# Patient Record
Sex: Female | Born: 1935
Health system: Southern US, Community
[De-identification: ages and names within clinical notes are randomized; demographics above are authoritative.]

## PROBLEM LIST (undated history)

## (undated) DIAGNOSIS — I4891 Unspecified atrial fibrillation: Secondary | ICD-10-CM

## (undated) HISTORY — PX: CARDIAC CATHETERIZATION: SHX172

---

## 2017-01-10 DIAGNOSIS — I429 Cardiomyopathy, unspecified: Secondary | ICD-10-CM | POA: Diagnosis not present

## 2017-01-10 DIAGNOSIS — I10 Essential (primary) hypertension: Secondary | ICD-10-CM | POA: Diagnosis not present

## 2017-01-10 DIAGNOSIS — M549 Dorsalgia, unspecified: Secondary | ICD-10-CM | POA: Diagnosis not present

## 2017-01-10 DIAGNOSIS — E785 Hyperlipidemia, unspecified: Secondary | ICD-10-CM | POA: Diagnosis not present

## 2017-01-13 DIAGNOSIS — I10 Essential (primary) hypertension: Secondary | ICD-10-CM | POA: Diagnosis not present

## 2017-01-13 DIAGNOSIS — I429 Cardiomyopathy, unspecified: Secondary | ICD-10-CM | POA: Diagnosis not present

## 2017-01-13 DIAGNOSIS — I480 Paroxysmal atrial fibrillation: Secondary | ICD-10-CM | POA: Diagnosis not present

## 2017-01-13 DIAGNOSIS — E785 Hyperlipidemia, unspecified: Secondary | ICD-10-CM | POA: Diagnosis not present

## 2017-02-10 DIAGNOSIS — D649 Anemia, unspecified: Secondary | ICD-10-CM | POA: Diagnosis not present

## 2017-02-10 DIAGNOSIS — M859 Disorder of bone density and structure, unspecified: Secondary | ICD-10-CM | POA: Diagnosis not present

## 2017-02-14 DIAGNOSIS — M8889 Osteitis deformans of multiple sites: Secondary | ICD-10-CM | POA: Diagnosis not present

## 2017-02-17 DIAGNOSIS — M859 Disorder of bone density and structure, unspecified: Secondary | ICD-10-CM | POA: Diagnosis not present

## 2017-02-17 DIAGNOSIS — M8889 Osteitis deformans of multiple sites: Secondary | ICD-10-CM | POA: Diagnosis not present

## 2017-02-17 DIAGNOSIS — D649 Anemia, unspecified: Secondary | ICD-10-CM | POA: Diagnosis not present

## 2017-02-22 DIAGNOSIS — R350 Frequency of micturition: Secondary | ICD-10-CM | POA: Diagnosis not present

## 2017-02-22 DIAGNOSIS — N390 Urinary tract infection, site not specified: Secondary | ICD-10-CM | POA: Diagnosis not present

## 2017-02-22 DIAGNOSIS — Z6825 Body mass index (BMI) 25.0-25.9, adult: Secondary | ICD-10-CM | POA: Diagnosis not present

## 2017-03-03 DIAGNOSIS — L821 Other seborrheic keratosis: Secondary | ICD-10-CM | POA: Diagnosis not present

## 2017-03-03 DIAGNOSIS — Z85828 Personal history of other malignant neoplasm of skin: Secondary | ICD-10-CM | POA: Diagnosis not present

## 2017-03-03 DIAGNOSIS — D225 Melanocytic nevi of trunk: Secondary | ICD-10-CM | POA: Diagnosis not present

## 2017-03-03 DIAGNOSIS — D485 Neoplasm of uncertain behavior of skin: Secondary | ICD-10-CM | POA: Diagnosis not present

## 2017-03-03 DIAGNOSIS — Z08 Encounter for follow-up examination after completed treatment for malignant neoplasm: Secondary | ICD-10-CM | POA: Diagnosis not present

## 2017-03-03 DIAGNOSIS — L570 Actinic keratosis: Secondary | ICD-10-CM | POA: Diagnosis not present

## 2017-03-17 DIAGNOSIS — M8889 Osteitis deformans of multiple sites: Secondary | ICD-10-CM | POA: Diagnosis not present

## 2017-03-17 DIAGNOSIS — Z79899 Other long term (current) drug therapy: Secondary | ICD-10-CM | POA: Diagnosis not present

## 2017-03-18 DIAGNOSIS — I10 Essential (primary) hypertension: Secondary | ICD-10-CM | POA: Diagnosis not present

## 2017-03-18 DIAGNOSIS — E785 Hyperlipidemia, unspecified: Secondary | ICD-10-CM | POA: Diagnosis not present

## 2017-03-18 DIAGNOSIS — E1165 Type 2 diabetes mellitus with hyperglycemia: Secondary | ICD-10-CM | POA: Diagnosis not present

## 2017-03-18 DIAGNOSIS — I429 Cardiomyopathy, unspecified: Secondary | ICD-10-CM | POA: Diagnosis not present

## 2017-03-18 DIAGNOSIS — J019 Acute sinusitis, unspecified: Secondary | ICD-10-CM | POA: Diagnosis not present

## 2017-03-18 DIAGNOSIS — M5431 Sciatica, right side: Secondary | ICD-10-CM | POA: Diagnosis not present

## 2017-03-24 DIAGNOSIS — R748 Abnormal levels of other serum enzymes: Secondary | ICD-10-CM | POA: Diagnosis not present

## 2017-04-04 DIAGNOSIS — J329 Chronic sinusitis, unspecified: Secondary | ICD-10-CM | POA: Diagnosis not present

## 2017-04-04 DIAGNOSIS — M5431 Sciatica, right side: Secondary | ICD-10-CM | POA: Diagnosis not present

## 2017-04-04 DIAGNOSIS — E1165 Type 2 diabetes mellitus with hyperglycemia: Secondary | ICD-10-CM | POA: Diagnosis not present

## 2017-04-04 DIAGNOSIS — I10 Essential (primary) hypertension: Secondary | ICD-10-CM | POA: Diagnosis not present

## 2017-04-06 DIAGNOSIS — M2578 Osteophyte, vertebrae: Secondary | ICD-10-CM | POA: Diagnosis not present

## 2017-04-06 DIAGNOSIS — M5126 Other intervertebral disc displacement, lumbar region: Secondary | ICD-10-CM | POA: Diagnosis not present

## 2017-04-06 DIAGNOSIS — M4186 Other forms of scoliosis, lumbar region: Secondary | ICD-10-CM | POA: Diagnosis not present

## 2017-04-06 DIAGNOSIS — M4727 Other spondylosis with radiculopathy, lumbosacral region: Secondary | ICD-10-CM | POA: Diagnosis not present

## 2017-04-06 DIAGNOSIS — M5117 Intervertebral disc disorders with radiculopathy, lumbosacral region: Secondary | ICD-10-CM | POA: Diagnosis not present

## 2017-04-06 DIAGNOSIS — M5186 Other intervertebral disc disorders, lumbar region: Secondary | ICD-10-CM | POA: Diagnosis not present

## 2017-04-06 DIAGNOSIS — M48061 Spinal stenosis, lumbar region without neurogenic claudication: Secondary | ICD-10-CM | POA: Diagnosis not present

## 2017-04-06 DIAGNOSIS — M5127 Other intervertebral disc displacement, lumbosacral region: Secondary | ICD-10-CM | POA: Diagnosis not present

## 2017-04-18 DIAGNOSIS — M5431 Sciatica, right side: Secondary | ICD-10-CM | POA: Diagnosis not present

## 2017-04-18 DIAGNOSIS — M129 Arthropathy, unspecified: Secondary | ICD-10-CM | POA: Diagnosis not present

## 2017-04-18 DIAGNOSIS — I480 Paroxysmal atrial fibrillation: Secondary | ICD-10-CM | POA: Diagnosis not present

## 2017-04-18 DIAGNOSIS — I429 Cardiomyopathy, unspecified: Secondary | ICD-10-CM | POA: Diagnosis not present

## 2017-04-18 DIAGNOSIS — G501 Atypical facial pain: Secondary | ICD-10-CM | POA: Diagnosis not present

## 2017-04-18 DIAGNOSIS — J019 Acute sinusitis, unspecified: Secondary | ICD-10-CM | POA: Diagnosis not present

## 2017-04-18 DIAGNOSIS — J329 Chronic sinusitis, unspecified: Secondary | ICD-10-CM | POA: Diagnosis not present

## 2017-04-18 DIAGNOSIS — E785 Hyperlipidemia, unspecified: Secondary | ICD-10-CM | POA: Diagnosis not present

## 2017-05-05 DIAGNOSIS — M5416 Radiculopathy, lumbar region: Secondary | ICD-10-CM | POA: Diagnosis not present

## 2017-05-06 DIAGNOSIS — I480 Paroxysmal atrial fibrillation: Secondary | ICD-10-CM | POA: Diagnosis not present

## 2017-05-06 DIAGNOSIS — R609 Edema, unspecified: Secondary | ICD-10-CM | POA: Diagnosis not present

## 2017-05-06 DIAGNOSIS — I10 Essential (primary) hypertension: Secondary | ICD-10-CM | POA: Diagnosis not present

## 2017-05-09 DIAGNOSIS — E785 Hyperlipidemia, unspecified: Secondary | ICD-10-CM | POA: Diagnosis not present

## 2017-05-09 DIAGNOSIS — E1165 Type 2 diabetes mellitus with hyperglycemia: Secondary | ICD-10-CM | POA: Diagnosis not present

## 2017-05-09 DIAGNOSIS — I5031 Acute diastolic (congestive) heart failure: Secondary | ICD-10-CM | POA: Diagnosis not present

## 2017-05-09 DIAGNOSIS — I10 Essential (primary) hypertension: Secondary | ICD-10-CM | POA: Diagnosis not present

## 2017-05-17 DIAGNOSIS — I4891 Unspecified atrial fibrillation: Secondary | ICD-10-CM | POA: Diagnosis not present

## 2017-05-17 DIAGNOSIS — I5031 Acute diastolic (congestive) heart failure: Secondary | ICD-10-CM | POA: Diagnosis not present

## 2017-05-17 DIAGNOSIS — E785 Hyperlipidemia, unspecified: Secondary | ICD-10-CM | POA: Diagnosis not present

## 2017-05-17 DIAGNOSIS — I10 Essential (primary) hypertension: Secondary | ICD-10-CM | POA: Diagnosis not present

## 2017-05-18 DIAGNOSIS — M545 Low back pain: Secondary | ICD-10-CM | POA: Diagnosis not present

## 2017-05-18 DIAGNOSIS — M5416 Radiculopathy, lumbar region: Secondary | ICD-10-CM | POA: Diagnosis not present

## 2017-05-18 DIAGNOSIS — Z1389 Encounter for screening for other disorder: Secondary | ICD-10-CM | POA: Diagnosis not present

## 2017-05-18 DIAGNOSIS — M8888 Osteitis deformans of other bones: Secondary | ICD-10-CM | POA: Diagnosis not present

## 2017-05-24 DIAGNOSIS — I10 Essential (primary) hypertension: Secondary | ICD-10-CM | POA: Diagnosis not present

## 2017-05-24 DIAGNOSIS — E785 Hyperlipidemia, unspecified: Secondary | ICD-10-CM | POA: Diagnosis not present

## 2017-05-24 DIAGNOSIS — I4891 Unspecified atrial fibrillation: Secondary | ICD-10-CM | POA: Diagnosis not present

## 2017-05-25 DIAGNOSIS — I429 Cardiomyopathy, unspecified: Secondary | ICD-10-CM | POA: Diagnosis not present

## 2017-05-25 DIAGNOSIS — E1165 Type 2 diabetes mellitus with hyperglycemia: Secondary | ICD-10-CM | POA: Diagnosis not present

## 2017-05-25 DIAGNOSIS — M129 Arthropathy, unspecified: Secondary | ICD-10-CM | POA: Diagnosis not present

## 2017-05-25 DIAGNOSIS — I10 Essential (primary) hypertension: Secondary | ICD-10-CM | POA: Diagnosis not present

## 2017-06-10 DIAGNOSIS — E1165 Type 2 diabetes mellitus with hyperglycemia: Secondary | ICD-10-CM | POA: Diagnosis not present

## 2017-06-10 DIAGNOSIS — E785 Hyperlipidemia, unspecified: Secondary | ICD-10-CM | POA: Diagnosis not present

## 2017-06-10 DIAGNOSIS — R197 Diarrhea, unspecified: Secondary | ICD-10-CM | POA: Diagnosis not present

## 2017-06-10 DIAGNOSIS — M5431 Sciatica, right side: Secondary | ICD-10-CM | POA: Diagnosis not present

## 2017-06-10 DIAGNOSIS — I480 Paroxysmal atrial fibrillation: Secondary | ICD-10-CM | POA: Diagnosis not present

## 2017-06-10 DIAGNOSIS — M129 Arthropathy, unspecified: Secondary | ICD-10-CM | POA: Diagnosis not present

## 2017-06-10 DIAGNOSIS — I429 Cardiomyopathy, unspecified: Secondary | ICD-10-CM | POA: Diagnosis not present

## 2017-06-10 DIAGNOSIS — I10 Essential (primary) hypertension: Secondary | ICD-10-CM | POA: Diagnosis not present

## 2017-06-14 DIAGNOSIS — D509 Iron deficiency anemia, unspecified: Secondary | ICD-10-CM | POA: Diagnosis not present

## 2017-06-14 DIAGNOSIS — M8889 Osteitis deformans of multiple sites: Secondary | ICD-10-CM | POA: Diagnosis not present

## 2017-06-17 DIAGNOSIS — D225 Melanocytic nevi of trunk: Secondary | ICD-10-CM | POA: Diagnosis not present

## 2017-06-23 DIAGNOSIS — M810 Age-related osteoporosis without current pathological fracture: Secondary | ICD-10-CM | POA: Diagnosis not present

## 2017-06-23 DIAGNOSIS — M4186 Other forms of scoliosis, lumbar region: Secondary | ICD-10-CM | POA: Diagnosis not present

## 2017-06-23 DIAGNOSIS — M8888 Osteitis deformans of other bones: Secondary | ICD-10-CM | POA: Diagnosis not present

## 2017-06-23 DIAGNOSIS — M5136 Other intervertebral disc degeneration, lumbar region: Secondary | ICD-10-CM | POA: Diagnosis not present

## 2017-06-23 DIAGNOSIS — M5126 Other intervertebral disc displacement, lumbar region: Secondary | ICD-10-CM | POA: Diagnosis not present

## 2017-06-23 DIAGNOSIS — M5416 Radiculopathy, lumbar region: Secondary | ICD-10-CM | POA: Diagnosis not present

## 2017-06-29 DIAGNOSIS — R300 Dysuria: Secondary | ICD-10-CM | POA: Diagnosis not present

## 2017-06-29 DIAGNOSIS — N760 Acute vaginitis: Secondary | ICD-10-CM | POA: Diagnosis not present

## 2017-07-27 DIAGNOSIS — I10 Essential (primary) hypertension: Secondary | ICD-10-CM | POA: Diagnosis not present

## 2017-07-27 DIAGNOSIS — E1165 Type 2 diabetes mellitus with hyperglycemia: Secondary | ICD-10-CM | POA: Diagnosis not present

## 2017-07-27 DIAGNOSIS — I480 Paroxysmal atrial fibrillation: Secondary | ICD-10-CM | POA: Diagnosis not present

## 2017-07-27 DIAGNOSIS — M549 Dorsalgia, unspecified: Secondary | ICD-10-CM | POA: Diagnosis not present

## 2017-07-27 DIAGNOSIS — E785 Hyperlipidemia, unspecified: Secondary | ICD-10-CM | POA: Diagnosis not present

## 2017-08-01 DIAGNOSIS — M4186 Other forms of scoliosis, lumbar region: Secondary | ICD-10-CM | POA: Diagnosis not present

## 2017-08-01 DIAGNOSIS — M810 Age-related osteoporosis without current pathological fracture: Secondary | ICD-10-CM | POA: Diagnosis not present

## 2017-08-01 DIAGNOSIS — M5126 Other intervertebral disc displacement, lumbar region: Secondary | ICD-10-CM | POA: Diagnosis not present

## 2017-08-01 DIAGNOSIS — M5416 Radiculopathy, lumbar region: Secondary | ICD-10-CM | POA: Diagnosis not present

## 2017-08-01 DIAGNOSIS — M8888 Osteitis deformans of other bones: Secondary | ICD-10-CM | POA: Diagnosis not present

## 2017-08-01 DIAGNOSIS — M5136 Other intervertebral disc degeneration, lumbar region: Secondary | ICD-10-CM | POA: Diagnosis not present

## 2017-08-22 DIAGNOSIS — I4891 Unspecified atrial fibrillation: Secondary | ICD-10-CM | POA: Diagnosis not present

## 2017-08-22 DIAGNOSIS — I10 Essential (primary) hypertension: Secondary | ICD-10-CM | POA: Diagnosis not present

## 2017-08-22 DIAGNOSIS — I2510 Atherosclerotic heart disease of native coronary artery without angina pectoris: Secondary | ICD-10-CM | POA: Diagnosis not present

## 2017-08-22 DIAGNOSIS — E785 Hyperlipidemia, unspecified: Secondary | ICD-10-CM | POA: Diagnosis not present

## 2017-08-25 DIAGNOSIS — M48062 Spinal stenosis, lumbar region with neurogenic claudication: Secondary | ICD-10-CM | POA: Diagnosis not present

## 2017-08-25 DIAGNOSIS — M5416 Radiculopathy, lumbar region: Secondary | ICD-10-CM | POA: Diagnosis not present

## 2017-08-25 DIAGNOSIS — M5417 Radiculopathy, lumbosacral region: Secondary | ICD-10-CM | POA: Diagnosis not present

## 2017-09-05 DIAGNOSIS — M129 Arthropathy, unspecified: Secondary | ICD-10-CM | POA: Diagnosis not present

## 2017-09-05 DIAGNOSIS — M5431 Sciatica, right side: Secondary | ICD-10-CM | POA: Diagnosis not present

## 2017-09-05 DIAGNOSIS — E1165 Type 2 diabetes mellitus with hyperglycemia: Secondary | ICD-10-CM | POA: Diagnosis not present

## 2017-09-05 DIAGNOSIS — E785 Hyperlipidemia, unspecified: Secondary | ICD-10-CM | POA: Diagnosis not present

## 2017-09-05 DIAGNOSIS — R300 Dysuria: Secondary | ICD-10-CM | POA: Diagnosis not present

## 2017-09-05 DIAGNOSIS — I2510 Atherosclerotic heart disease of native coronary artery without angina pectoris: Secondary | ICD-10-CM | POA: Diagnosis not present

## 2017-09-05 DIAGNOSIS — G500 Trigeminal neuralgia: Secondary | ICD-10-CM | POA: Diagnosis not present

## 2017-09-05 DIAGNOSIS — I429 Cardiomyopathy, unspecified: Secondary | ICD-10-CM | POA: Diagnosis not present

## 2017-10-13 DIAGNOSIS — M8889 Osteitis deformans of multiple sites: Secondary | ICD-10-CM | POA: Diagnosis not present

## 2017-10-13 DIAGNOSIS — D509 Iron deficiency anemia, unspecified: Secondary | ICD-10-CM | POA: Diagnosis not present

## 2017-10-13 DIAGNOSIS — Z23 Encounter for immunization: Secondary | ICD-10-CM | POA: Diagnosis not present

## 2017-10-13 DIAGNOSIS — M79606 Pain in leg, unspecified: Secondary | ICD-10-CM | POA: Diagnosis not present

## 2017-10-17 DIAGNOSIS — M9903 Segmental and somatic dysfunction of lumbar region: Secondary | ICD-10-CM | POA: Diagnosis not present

## 2017-10-17 DIAGNOSIS — M5116 Intervertebral disc disorders with radiculopathy, lumbar region: Secondary | ICD-10-CM | POA: Diagnosis not present

## 2017-10-17 DIAGNOSIS — M5441 Lumbago with sciatica, right side: Secondary | ICD-10-CM | POA: Diagnosis not present

## 2017-10-17 DIAGNOSIS — S336XXA Sprain of sacroiliac joint, initial encounter: Secondary | ICD-10-CM | POA: Diagnosis not present

## 2017-10-19 DIAGNOSIS — M9903 Segmental and somatic dysfunction of lumbar region: Secondary | ICD-10-CM | POA: Diagnosis not present

## 2017-10-19 DIAGNOSIS — M5116 Intervertebral disc disorders with radiculopathy, lumbar region: Secondary | ICD-10-CM | POA: Diagnosis not present

## 2017-10-19 DIAGNOSIS — M5441 Lumbago with sciatica, right side: Secondary | ICD-10-CM | POA: Diagnosis not present

## 2017-10-19 DIAGNOSIS — S336XXA Sprain of sacroiliac joint, initial encounter: Secondary | ICD-10-CM | POA: Diagnosis not present

## 2017-10-24 DIAGNOSIS — M9903 Segmental and somatic dysfunction of lumbar region: Secondary | ICD-10-CM | POA: Diagnosis not present

## 2017-10-24 DIAGNOSIS — M5116 Intervertebral disc disorders with radiculopathy, lumbar region: Secondary | ICD-10-CM | POA: Diagnosis not present

## 2017-10-24 DIAGNOSIS — S336XXA Sprain of sacroiliac joint, initial encounter: Secondary | ICD-10-CM | POA: Diagnosis not present

## 2017-10-24 DIAGNOSIS — M5441 Lumbago with sciatica, right side: Secondary | ICD-10-CM | POA: Diagnosis not present

## 2017-10-25 DIAGNOSIS — M5441 Lumbago with sciatica, right side: Secondary | ICD-10-CM | POA: Diagnosis not present

## 2017-10-25 DIAGNOSIS — M9903 Segmental and somatic dysfunction of lumbar region: Secondary | ICD-10-CM | POA: Diagnosis not present

## 2017-10-25 DIAGNOSIS — S336XXA Sprain of sacroiliac joint, initial encounter: Secondary | ICD-10-CM | POA: Diagnosis not present

## 2017-10-25 DIAGNOSIS — M5116 Intervertebral disc disorders with radiculopathy, lumbar region: Secondary | ICD-10-CM | POA: Diagnosis not present

## 2017-10-31 DIAGNOSIS — M5441 Lumbago with sciatica, right side: Secondary | ICD-10-CM | POA: Diagnosis not present

## 2017-10-31 DIAGNOSIS — S336XXA Sprain of sacroiliac joint, initial encounter: Secondary | ICD-10-CM | POA: Diagnosis not present

## 2017-10-31 DIAGNOSIS — M9903 Segmental and somatic dysfunction of lumbar region: Secondary | ICD-10-CM | POA: Diagnosis not present

## 2017-10-31 DIAGNOSIS — M5116 Intervertebral disc disorders with radiculopathy, lumbar region: Secondary | ICD-10-CM | POA: Diagnosis not present

## 2017-11-02 DIAGNOSIS — M5116 Intervertebral disc disorders with radiculopathy, lumbar region: Secondary | ICD-10-CM | POA: Diagnosis not present

## 2017-11-02 DIAGNOSIS — S336XXA Sprain of sacroiliac joint, initial encounter: Secondary | ICD-10-CM | POA: Diagnosis not present

## 2017-11-02 DIAGNOSIS — M9903 Segmental and somatic dysfunction of lumbar region: Secondary | ICD-10-CM | POA: Diagnosis not present

## 2017-11-02 DIAGNOSIS — M5441 Lumbago with sciatica, right side: Secondary | ICD-10-CM | POA: Diagnosis not present

## 2017-11-04 DIAGNOSIS — M5116 Intervertebral disc disorders with radiculopathy, lumbar region: Secondary | ICD-10-CM | POA: Diagnosis not present

## 2017-11-04 DIAGNOSIS — S336XXA Sprain of sacroiliac joint, initial encounter: Secondary | ICD-10-CM | POA: Diagnosis not present

## 2017-11-04 DIAGNOSIS — M5441 Lumbago with sciatica, right side: Secondary | ICD-10-CM | POA: Diagnosis not present

## 2017-11-04 DIAGNOSIS — M9903 Segmental and somatic dysfunction of lumbar region: Secondary | ICD-10-CM | POA: Diagnosis not present

## 2017-11-07 DIAGNOSIS — M9903 Segmental and somatic dysfunction of lumbar region: Secondary | ICD-10-CM | POA: Diagnosis not present

## 2017-11-07 DIAGNOSIS — S336XXA Sprain of sacroiliac joint, initial encounter: Secondary | ICD-10-CM | POA: Diagnosis not present

## 2017-11-07 DIAGNOSIS — M5441 Lumbago with sciatica, right side: Secondary | ICD-10-CM | POA: Diagnosis not present

## 2017-11-07 DIAGNOSIS — M5116 Intervertebral disc disorders with radiculopathy, lumbar region: Secondary | ICD-10-CM | POA: Diagnosis not present

## 2017-11-09 DIAGNOSIS — M5116 Intervertebral disc disorders with radiculopathy, lumbar region: Secondary | ICD-10-CM | POA: Diagnosis not present

## 2017-11-09 DIAGNOSIS — S336XXA Sprain of sacroiliac joint, initial encounter: Secondary | ICD-10-CM | POA: Diagnosis not present

## 2017-11-09 DIAGNOSIS — M5441 Lumbago with sciatica, right side: Secondary | ICD-10-CM | POA: Diagnosis not present

## 2017-11-09 DIAGNOSIS — M9903 Segmental and somatic dysfunction of lumbar region: Secondary | ICD-10-CM | POA: Diagnosis not present

## 2017-11-14 DIAGNOSIS — M5441 Lumbago with sciatica, right side: Secondary | ICD-10-CM | POA: Diagnosis not present

## 2017-11-14 DIAGNOSIS — S336XXA Sprain of sacroiliac joint, initial encounter: Secondary | ICD-10-CM | POA: Diagnosis not present

## 2017-11-14 DIAGNOSIS — M5116 Intervertebral disc disorders with radiculopathy, lumbar region: Secondary | ICD-10-CM | POA: Diagnosis not present

## 2017-11-14 DIAGNOSIS — M9903 Segmental and somatic dysfunction of lumbar region: Secondary | ICD-10-CM | POA: Diagnosis not present

## 2017-11-21 DIAGNOSIS — I10 Essential (primary) hypertension: Secondary | ICD-10-CM | POA: Diagnosis not present

## 2017-11-21 DIAGNOSIS — E785 Hyperlipidemia, unspecified: Secondary | ICD-10-CM | POA: Diagnosis not present

## 2017-11-21 DIAGNOSIS — I4891 Unspecified atrial fibrillation: Secondary | ICD-10-CM | POA: Diagnosis not present

## 2017-11-22 DIAGNOSIS — S336XXA Sprain of sacroiliac joint, initial encounter: Secondary | ICD-10-CM | POA: Diagnosis not present

## 2017-11-22 DIAGNOSIS — M9903 Segmental and somatic dysfunction of lumbar region: Secondary | ICD-10-CM | POA: Diagnosis not present

## 2017-11-22 DIAGNOSIS — M5441 Lumbago with sciatica, right side: Secondary | ICD-10-CM | POA: Diagnosis not present

## 2017-11-22 DIAGNOSIS — M5116 Intervertebral disc disorders with radiculopathy, lumbar region: Secondary | ICD-10-CM | POA: Diagnosis not present

## 2017-11-23 DIAGNOSIS — S336XXA Sprain of sacroiliac joint, initial encounter: Secondary | ICD-10-CM | POA: Diagnosis not present

## 2017-11-23 DIAGNOSIS — M5116 Intervertebral disc disorders with radiculopathy, lumbar region: Secondary | ICD-10-CM | POA: Diagnosis not present

## 2017-11-23 DIAGNOSIS — M5441 Lumbago with sciatica, right side: Secondary | ICD-10-CM | POA: Diagnosis not present

## 2017-11-23 DIAGNOSIS — M9903 Segmental and somatic dysfunction of lumbar region: Secondary | ICD-10-CM | POA: Diagnosis not present

## 2017-11-28 DIAGNOSIS — S336XXA Sprain of sacroiliac joint, initial encounter: Secondary | ICD-10-CM | POA: Diagnosis not present

## 2017-11-28 DIAGNOSIS — M9903 Segmental and somatic dysfunction of lumbar region: Secondary | ICD-10-CM | POA: Diagnosis not present

## 2017-11-28 DIAGNOSIS — M5116 Intervertebral disc disorders with radiculopathy, lumbar region: Secondary | ICD-10-CM | POA: Diagnosis not present

## 2017-11-28 DIAGNOSIS — M5441 Lumbago with sciatica, right side: Secondary | ICD-10-CM | POA: Diagnosis not present

## 2017-12-01 DIAGNOSIS — Z1231 Encounter for screening mammogram for malignant neoplasm of breast: Secondary | ICD-10-CM | POA: Diagnosis not present

## 2017-12-01 DIAGNOSIS — Z803 Family history of malignant neoplasm of breast: Secondary | ICD-10-CM | POA: Diagnosis not present

## 2017-12-01 DIAGNOSIS — Z9289 Personal history of other medical treatment: Secondary | ICD-10-CM | POA: Diagnosis not present

## 2018-01-08 DIAGNOSIS — N390 Urinary tract infection, site not specified: Secondary | ICD-10-CM | POA: Diagnosis not present

## 2018-02-06 DIAGNOSIS — D509 Iron deficiency anemia, unspecified: Secondary | ICD-10-CM | POA: Diagnosis not present

## 2018-02-06 DIAGNOSIS — M8889 Osteitis deformans of multiple sites: Secondary | ICD-10-CM | POA: Diagnosis not present

## 2018-02-16 DIAGNOSIS — D649 Anemia, unspecified: Secondary | ICD-10-CM | POA: Diagnosis not present

## 2018-02-20 DIAGNOSIS — I4891 Unspecified atrial fibrillation: Secondary | ICD-10-CM | POA: Diagnosis not present

## 2018-02-20 DIAGNOSIS — I509 Heart failure, unspecified: Secondary | ICD-10-CM | POA: Diagnosis not present

## 2018-02-20 DIAGNOSIS — E785 Hyperlipidemia, unspecified: Secondary | ICD-10-CM | POA: Diagnosis not present

## 2018-02-20 DIAGNOSIS — I10 Essential (primary) hypertension: Secondary | ICD-10-CM | POA: Diagnosis not present

## 2018-02-22 DIAGNOSIS — R310 Gross hematuria: Secondary | ICD-10-CM | POA: Diagnosis not present

## 2018-02-22 DIAGNOSIS — R350 Frequency of micturition: Secondary | ICD-10-CM | POA: Diagnosis not present

## 2018-02-22 DIAGNOSIS — N390 Urinary tract infection, site not specified: Secondary | ICD-10-CM | POA: Diagnosis not present

## 2018-02-22 DIAGNOSIS — Z6823 Body mass index (BMI) 23.0-23.9, adult: Secondary | ICD-10-CM | POA: Diagnosis not present

## 2018-02-24 DIAGNOSIS — R319 Hematuria, unspecified: Secondary | ICD-10-CM | POA: Diagnosis not present

## 2018-03-13 DIAGNOSIS — R319 Hematuria, unspecified: Secondary | ICD-10-CM | POA: Diagnosis not present

## 2018-04-06 DIAGNOSIS — Z0000 Encounter for general adult medical examination without abnormal findings: Secondary | ICD-10-CM | POA: Diagnosis not present

## 2018-04-06 DIAGNOSIS — Z01411 Encounter for gynecological examination (general) (routine) with abnormal findings: Secondary | ICD-10-CM | POA: Diagnosis not present

## 2018-04-06 DIAGNOSIS — Z1212 Encounter for screening for malignant neoplasm of rectum: Secondary | ICD-10-CM | POA: Diagnosis not present

## 2018-05-16 DIAGNOSIS — I480 Paroxysmal atrial fibrillation: Secondary | ICD-10-CM | POA: Diagnosis not present

## 2018-05-16 DIAGNOSIS — R079 Chest pain, unspecified: Secondary | ICD-10-CM | POA: Diagnosis not present

## 2018-05-16 DIAGNOSIS — I10 Essential (primary) hypertension: Secondary | ICD-10-CM | POA: Diagnosis not present

## 2018-05-16 DIAGNOSIS — I2510 Atherosclerotic heart disease of native coronary artery without angina pectoris: Secondary | ICD-10-CM | POA: Diagnosis not present

## 2018-05-17 DIAGNOSIS — D485 Neoplasm of uncertain behavior of skin: Secondary | ICD-10-CM | POA: Diagnosis not present

## 2018-05-17 DIAGNOSIS — L570 Actinic keratosis: Secondary | ICD-10-CM | POA: Diagnosis not present

## 2018-06-08 DIAGNOSIS — I480 Paroxysmal atrial fibrillation: Secondary | ICD-10-CM | POA: Diagnosis not present

## 2018-06-08 DIAGNOSIS — I2510 Atherosclerotic heart disease of native coronary artery without angina pectoris: Secondary | ICD-10-CM | POA: Diagnosis not present

## 2018-06-08 DIAGNOSIS — I10 Essential (primary) hypertension: Secondary | ICD-10-CM | POA: Diagnosis not present

## 2018-06-09 DIAGNOSIS — I2510 Atherosclerotic heart disease of native coronary artery without angina pectoris: Secondary | ICD-10-CM | POA: Diagnosis not present

## 2018-06-09 DIAGNOSIS — M129 Arthropathy, unspecified: Secondary | ICD-10-CM | POA: Diagnosis not present

## 2018-06-09 DIAGNOSIS — E1165 Type 2 diabetes mellitus with hyperglycemia: Secondary | ICD-10-CM | POA: Diagnosis not present

## 2018-06-09 DIAGNOSIS — I5031 Acute diastolic (congestive) heart failure: Secondary | ICD-10-CM | POA: Diagnosis not present

## 2018-06-09 DIAGNOSIS — I480 Paroxysmal atrial fibrillation: Secondary | ICD-10-CM | POA: Diagnosis not present

## 2018-07-12 DIAGNOSIS — I10 Essential (primary) hypertension: Secondary | ICD-10-CM | POA: Diagnosis not present

## 2018-07-12 DIAGNOSIS — I481 Persistent atrial fibrillation: Secondary | ICD-10-CM | POA: Diagnosis not present

## 2018-07-12 DIAGNOSIS — R600 Localized edema: Secondary | ICD-10-CM | POA: Diagnosis not present

## 2018-09-02 ENCOUNTER — Emergency Department (HOSPITAL_COMMUNITY)
Admission: EM | Admit: 2018-09-02 | Discharge: 2018-09-03 | Disposition: A | Discharge status: Home / Self Care | Payer: Medicare Other | Attending: Emergency Medicine | Admitting: Emergency Medicine

## 2018-09-02 ENCOUNTER — Emergency Department (HOSPITAL_COMMUNITY): Payer: Medicare Other

## 2018-09-02 ENCOUNTER — Encounter (HOSPITAL_COMMUNITY): Payer: Self-pay

## 2018-09-02 ENCOUNTER — Other Ambulatory Visit: Payer: Self-pay

## 2018-09-02 DIAGNOSIS — I4891 Unspecified atrial fibrillation: Secondary | ICD-10-CM | POA: Diagnosis not present

## 2018-09-02 DIAGNOSIS — R000 Tachycardia, unspecified: Secondary | ICD-10-CM | POA: Diagnosis not present

## 2018-09-02 DIAGNOSIS — I447 Left bundle-branch block, unspecified: Secondary | ICD-10-CM | POA: Diagnosis not present

## 2018-09-02 DIAGNOSIS — I480 Paroxysmal atrial fibrillation: Secondary | ICD-10-CM

## 2018-09-02 DIAGNOSIS — I10 Essential (primary) hypertension: Secondary | ICD-10-CM | POA: Diagnosis not present

## 2018-09-02 DIAGNOSIS — R110 Nausea: Secondary | ICD-10-CM | POA: Insufficient documentation

## 2018-09-02 DIAGNOSIS — R197 Diarrhea, unspecified: Secondary | ICD-10-CM | POA: Diagnosis not present

## 2018-09-02 DIAGNOSIS — F419 Anxiety disorder, unspecified: Secondary | ICD-10-CM | POA: Insufficient documentation

## 2018-09-02 DIAGNOSIS — R109 Unspecified abdominal pain: Secondary | ICD-10-CM | POA: Insufficient documentation

## 2018-09-02 DIAGNOSIS — I499 Cardiac arrhythmia, unspecified: Secondary | ICD-10-CM | POA: Diagnosis not present

## 2018-09-02 HISTORY — DX: Unspecified atrial fibrillation: I48.91

## 2018-09-02 LAB — COMPREHENSIVE METABOLIC PANEL
ALT: 20 U/L (ref 0–44)
AST: 25 U/L (ref 15–41)
Albumin: 4 g/dL (ref 3.5–5.0)
Alkaline Phosphatase: 80 U/L (ref 38–126)
Anion gap: 9 (ref 5–15)
BUN: 12 mg/dL (ref 8–23)
CALCIUM: 9.1 mg/dL (ref 8.9–10.3)
CO2: 23 mmol/L (ref 22–32)
CREATININE: 0.81 mg/dL (ref 0.44–1.00)
Chloride: 96 mmol/L — ABNORMAL LOW (ref 98–111)
GFR calc Af Amer: >60 mL/min (ref >60)
GFR calc non Af Amer: >60 mL/min (ref >60)
GLUCOSE: 211 mg/dL — AB (ref 70–99)
Potassium: 3.8 mmol/L (ref 3.5–5.1)
SODIUM: 128 mmol/L — AB (ref 135–145)
TOTAL PROTEIN: 6.4 g/dL — AB (ref 6.5–8.1)
Total Bilirubin: 1.2 mg/dL (ref 0.3–1.2)

## 2018-09-02 LAB — CBC
HCT: 35.6 % — ABNORMAL LOW (ref 36.0–46.0)
Hemoglobin: 11.8 g/dL — ABNORMAL LOW (ref 12.0–15.0)
MCH: 31 pg (ref 26.0–34.0)
MCHC: 33.1 g/dL (ref 30.0–36.0)
MCV: 93.4 fL (ref 78.0–100.0)
PLATELETS: 163 10*3/uL (ref 150–400)
RBC: 3.81 MIL/uL — ABNORMAL LOW (ref 3.87–5.11)
RDW: 12.1 % (ref 11.5–15.5)
WBC: 8.5 10*3/uL (ref 4.0–10.5)

## 2018-09-02 LAB — I-STAT TROPONIN, ED: Troponin i, poc: 0.02 ng/mL (ref 0.00–0.08)

## 2018-09-02 MED ORDER — ALPRAZOLAM 0.25 MG PO TABS
0.5000 mg | ORAL_TABLET | Freq: Once | ORAL | Status: AC
  Administered 2018-09-02: 0.5 mg via ORAL
  Filled 2018-09-02: qty 2

## 2018-09-02 NOTE — ED Triage Notes (Addendum)
Pt coming from home with her daughter by EMS for afib/ RVR. Pt. Reports new primary took pt. Off of benzos and started her on zoloft for anxiety and depression. Pt. Started this medication 1 week ago. Pt. Appears to be very anxious. Pt. Denies chest pain. Pt. Reports diarrhea and nausea. Given 4 of zofran en route. Pt. Has hx. Of afib and takes elequis. A/O X4 NAD.

## 2018-09-02 NOTE — ED Provider Notes (Signed)
[facility] EMERGENCY DEPARTMENT Provider Note   CSN: [id number] Arrival date & time: 09/02/18  2219     History   Chief Complaint No chief complaint on file.   HPI Kayla Contreras is a 84 y.o. female.  The history is provided by the patient and the EMS personnel.  Anxiety  This is a new problem. The current episode started 1 to 2 hours ago. The problem occurs constantly. The problem has been gradually worsening. Associated symptoms include abdominal pain. Pertinent negatives include no chest pain, no headaches and no shortness of breath. Nothing aggravates the symptoms. She has tried nothing for the symptoms.    Past Medical History:  Diagnosis Date  . Atrial fibrillation ([place])     There are no active problems to display for this patient.   Past Surgical History:  Procedure Laterality Date  . CARDIAC CATHETERIZATION       OB History   None      Home Medications    Prior to Admission medications   Medication Sig Start Date End Date Taking? Authorizing Provider  ondansetron (ZOFRAN ODT) 4 MG disintegrating tablet Take 1 tablet (4 mg total) by mouth every 8 (eight) hours as needed for nausea or vomiting. 09/03/18   Irven Baltimore, MD    Family History History reviewed. No pertinent family history.  Social History Social History   Tobacco Use  . Smoking status: Never Smoker  . Smokeless tobacco: Never Used  Substance Use Topics  . Alcohol use: Not on file  . Drug use: Not on file     Allergies   Patient has no known allergies.   Review of Systems Review of Systems  Constitutional: Negative for chills and fever.  HENT: Negative for ear pain and sore throat.   Eyes: Negative for pain and visual disturbance.  Respiratory: Negative for cough and shortness of breath.   Cardiovascular: Positive for palpitations. Negative for chest pain.  Gastrointestinal: Positive for abdominal pain, diarrhea (patient's daughter also having nausea,  vomiting, and diarrhea) and nausea. Negative for vomiting.  Genitourinary: Negative for dysuria and hematuria.  Musculoskeletal: Negative for arthralgias and back pain.  Skin: Negative for color change and rash.  Neurological: Negative for seizures, syncope and headaches.  All other systems reviewed and are negative.    Physical Exam Updated Vital Signs BP (!) 146/76   Pulse 82   Temp 97.6 F (36.4 C) (Oral)   Resp 14   Ht 5\' 1"  (1.549 m)   Wt 57.6 kg   SpO2 98%   BMI 24.00 kg/m   Physical Exam  Constitutional: She appears well-developed and well-nourished. No distress.  HENT:  Head: Normocephalic and atraumatic.  Eyes: Conjunctivae are normal.  Neck: Neck supple.  Cardiovascular: An irregularly irregular rhythm present. Tachycardia present.  No murmur heard. Pulmonary/Chest: Effort normal and breath sounds normal. No respiratory distress.  Abdominal: Soft. There is no tenderness.  Musculoskeletal: She exhibits no edema.  Neurological: She is alert.  Skin: Skin is warm and dry.  Psychiatric: Her mood appears anxious.  Nursing note and vitals reviewed.    ED Treatments / Results  Labs (all labs ordered are listed, but only abnormal results are displayed) Labs Reviewed  CBC - Abnormal; Notable for the following components:      Result Value   RBC 3.81 (*)    Hemoglobin 11.8 (*)    HCT 35.6 (*)    All other components within normal limits  COMPREHENSIVE METABOLIC PANEL -  Abnormal; Notable for the following components:   Sodium 128 (*)    Chloride 96 (*)    Glucose, Bld 211 (*)    Total Protein 6.4 (*)    All other components within normal limits  BRAIN NATRIURETIC PEPTIDE - Abnormal; Notable for the following components:   B Natriuretic Peptide 556.7 (*)    All other components within normal limits  I-STAT TROPONIN, ED    EKG EKG Interpretation  Date/Time:  Saturday September 02 2018 22:30:31 EDT Ventricular Rate:  88 PR Interval:    QRS  Duration: 129 QT Interval:  371 QTC Calculation: 449 R Axis:   161 Text Interpretation:  Atrial fibrillation Nonspecific intraventricular conduction delay Lateral infarct, old Probable anteroseptal infarct, recent No old tracing to compare Confirmed by Noemi Chapel [phone]) on 09/02/2018 11:01:52 PM   Radiology Dg Chest Portable 1 View  Result Date: 09/02/2018 CLINICAL DATA:  Atrial fibrillation. Diarrhea and nausea. EXAM: PORTABLE CHEST 1 VIEW COMPARISON:  None. FINDINGS: Borderline heart size with normal pulmonary vascularity. Slight interstitial pattern to the lungs may indicate fibrosis or possibly early edema. No focal consolidation. No airspace disease. No blunting of costophrenic angles. No pneumothorax. Calcification of the aorta. Degenerative changes in the shoulders. IMPRESSION: Slight interstitial pattern to the lungs may indicate fibrosis or early edema. No focal consolidation. Electronically Signed   By: Lucienne Capers M.D.   On: 09/02/2018 23:11    Procedures Procedures (including critical care time)  Medications Ordered in ED Medications  ALPRAZolam Duanne Moron) tablet 0.5 mg (0.5 mg Oral Given 09/02/18 2327)     Initial Impression / Assessment and Plan / ED Course  I have reviewed the triage vital signs and the nursing notes.  Pertinent labs & imaging results that were available during my care of the patient were reviewed by me and considered in my medical decision making (see chart for details).     The patient is an 84 year old female with past medical history of paroxysmal A. fib and depression who presents with anxiety.  The patient reports that she was previously on 1 mg of Xanax 2 times a day and is recently tapered to 2.5 mg once a day for the last 3 days.  The patient reports generalized anxiety and was tachycardic with EMS.  The patient was never at any point hypotensive.  The patient reports that she and her daughter both been having nausea and diarrhea today.  The  patient has not had any vomiting.  The patient was given 0.5 mg of Xanax and reports that her symptoms have resolved.  CBC shows mild anemia, no leukocytosis.  Troponin negative.  EKG shows atrial fibrillation without rapid ventricular response.  CMP reveals slightly low sodium and chloride, otherwise within normal limits.  BNP slightly elevated, however patient has normal SPO2, no crackles, she is denying shortness of breath, and chest x-ray does not show any pulmonary edema.  Patient reports that she has Xanax at home and will continue to take Xanax.  She reports that she will follow-up with her primary care provider early next week to help her taper her Xanax to prevent withdrawals.  The patient was given Zofran for her nausea.  The patient reports understanding of and agreement with the plan.  Patient was discharged.  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  Irven Baltimore, MD   Final Clinical Impressions(s) / ED Diagnoses   Final diagnoses:  Paroxysmal atrial fibrillation ([place])  Diarrhea, unspecified type    ED Discharge Orders  Ordered    ondansetron (ZOFRAN ODT) 4 MG disintegrating tablet  Every 8 hours PRN     09/03/18 0027           Irven Baltimore, MD 09/03/18 4585    Noemi Chapel, MD 09/04/18 [phone]

## 2018-09-03 LAB — BRAIN NATRIURETIC PEPTIDE: B Natriuretic Peptide: 556.7 pg/mL — ABNORMAL HIGH (ref 0.0–100.0)

## 2018-09-03 MED ORDER — ONDANSETRON 4 MG PO TBDP: 4.0000 mg | ORAL_TABLET | Freq: Three times a day (TID) | ORAL | 0 refills | Status: AC | PRN

## 2018-09-09 DIAGNOSIS — R197 Diarrhea, unspecified: Secondary | ICD-10-CM | POA: Diagnosis not present

## 2018-09-11 DIAGNOSIS — E119 Type 2 diabetes mellitus without complications: Secondary | ICD-10-CM | POA: Diagnosis not present

## 2018-09-11 DIAGNOSIS — Z961 Presence of intraocular lens: Secondary | ICD-10-CM | POA: Diagnosis not present

## 2018-09-11 DIAGNOSIS — H43813 Vitreous degeneration, bilateral: Secondary | ICD-10-CM | POA: Diagnosis not present

## 2018-09-11 DIAGNOSIS — H26493 Other secondary cataract, bilateral: Secondary | ICD-10-CM | POA: Diagnosis not present

## 2018-10-14 DIAGNOSIS — Z23 Encounter for immunization: Secondary | ICD-10-CM | POA: Diagnosis not present

## 2018-11-10 ENCOUNTER — Other Ambulatory Visit: Payer: Self-pay | Admitting: Internal Medicine

## 2018-11-10 DIAGNOSIS — Z1231 Encounter for screening mammogram for malignant neoplasm of breast: Secondary | ICD-10-CM

## 2018-12-07 DIAGNOSIS — Z1231 Encounter for screening mammogram for malignant neoplasm of breast: Secondary | ICD-10-CM | POA: Diagnosis not present

## 2018-12-12 ENCOUNTER — Observation Stay (HOSPITAL_COMMUNITY): Payer: Medicare Other

## 2018-12-12 ENCOUNTER — Inpatient Hospital Stay (HOSPITAL_COMMUNITY)
Admission: EM | Admit: 2018-12-12 | Discharge: 2018-12-18 | DRG: 065 | Disposition: A | Discharge status: Skilled Nursing Facility | Payer: Medicare Other | Attending: Internal Medicine | Admitting: Internal Medicine

## 2018-12-12 ENCOUNTER — Encounter (HOSPITAL_COMMUNITY): Payer: Self-pay | Admitting: Family Medicine

## 2018-12-12 ENCOUNTER — Emergency Department (HOSPITAL_COMMUNITY): Payer: Medicare Other

## 2018-12-12 DIAGNOSIS — G8929 Other chronic pain: Secondary | ICD-10-CM | POA: Diagnosis present

## 2018-12-12 DIAGNOSIS — I10 Essential (primary) hypertension: Secondary | ICD-10-CM

## 2018-12-12 DIAGNOSIS — G8194 Hemiplegia, unspecified affecting left nondominant side: Secondary | ICD-10-CM | POA: Diagnosis not present

## 2018-12-12 DIAGNOSIS — I63432 Cerebral infarction due to embolism of left posterior cerebral artery: Principal | ICD-10-CM | POA: Diagnosis present

## 2018-12-12 DIAGNOSIS — H5500 Unspecified nystagmus: Secondary | ICD-10-CM | POA: Diagnosis present

## 2018-12-12 DIAGNOSIS — I639 Cerebral infarction, unspecified: Secondary | ICD-10-CM | POA: Diagnosis present

## 2018-12-12 DIAGNOSIS — G9340 Encephalopathy, unspecified: Secondary | ICD-10-CM | POA: Diagnosis present

## 2018-12-12 DIAGNOSIS — R42 Dizziness and giddiness: Secondary | ICD-10-CM

## 2018-12-12 DIAGNOSIS — I252 Old myocardial infarction: Secondary | ICD-10-CM

## 2018-12-12 DIAGNOSIS — I472 Ventricular tachycardia: Secondary | ICD-10-CM | POA: Diagnosis present

## 2018-12-12 DIAGNOSIS — G250 Essential tremor: Secondary | ICD-10-CM | POA: Diagnosis present

## 2018-12-12 DIAGNOSIS — I4819 Other persistent atrial fibrillation: Secondary | ICD-10-CM | POA: Diagnosis not present

## 2018-12-12 DIAGNOSIS — R29701 NIHSS score 1: Secondary | ICD-10-CM | POA: Diagnosis present

## 2018-12-12 DIAGNOSIS — G894 Chronic pain syndrome: Secondary | ICD-10-CM

## 2018-12-12 DIAGNOSIS — I69398 Other sequelae of cerebral infarction: Secondary | ICD-10-CM

## 2018-12-12 DIAGNOSIS — N179 Acute kidney failure, unspecified: Secondary | ICD-10-CM

## 2018-12-12 DIAGNOSIS — I2510 Atherosclerotic heart disease of native coronary artery without angina pectoris: Secondary | ICD-10-CM | POA: Diagnosis present

## 2018-12-12 DIAGNOSIS — Z7901 Long term (current) use of anticoagulants: Secondary | ICD-10-CM

## 2018-12-12 DIAGNOSIS — M549 Dorsalgia, unspecified: Secondary | ICD-10-CM | POA: Diagnosis present

## 2018-12-12 DIAGNOSIS — R29810 Facial weakness: Secondary | ICD-10-CM | POA: Diagnosis present

## 2018-12-12 DIAGNOSIS — Z79899 Other long term (current) drug therapy: Secondary | ICD-10-CM

## 2018-12-12 DIAGNOSIS — I4891 Unspecified atrial fibrillation: Secondary | ICD-10-CM | POA: Diagnosis present

## 2018-12-12 DIAGNOSIS — E119 Type 2 diabetes mellitus without complications: Secondary | ICD-10-CM | POA: Diagnosis present

## 2018-12-12 DIAGNOSIS — Z7984 Long term (current) use of oral hypoglycemic drugs: Secondary | ICD-10-CM

## 2018-12-12 DIAGNOSIS — E785 Hyperlipidemia, unspecified: Secondary | ICD-10-CM | POA: Diagnosis present

## 2018-12-12 LAB — URINALYSIS, ROUTINE W REFLEX MICROSCOPIC
Bilirubin Urine: NEGATIVE
GLUCOSE, UA: 50 mg/dL — AB
HGB URINE DIPSTICK: NEGATIVE
KETONES UR: NEGATIVE mg/dL
Leukocytes, UA: NEGATIVE
Nitrite: NEGATIVE
PROTEIN: NEGATIVE mg/dL
Specific Gravity, Urine: 1.003 — ABNORMAL LOW (ref 1.005–1.030)
pH: 7 (ref 5.0–8.0)

## 2018-12-12 LAB — BASIC METABOLIC PANEL
Anion gap: 13 (ref 5–15)
BUN: 18 mg/dL (ref 8–23)
CALCIUM: 9.3 mg/dL (ref 8.9–10.3)
CO2: 23 mmol/L (ref 22–32)
Chloride: 103 mmol/L (ref 98–111)
Creatinine, Ser: 1.02 mg/dL — ABNORMAL HIGH (ref 0.44–1.00)
GFR calc Af Amer: 58 mL/min — ABNORMAL LOW (ref >60)
GFR, EST NON AFRICAN AMERICAN: 50 mL/min — AB (ref >60)
GLUCOSE: 211 mg/dL — AB (ref 70–99)
Potassium: 3.9 mmol/L (ref 3.5–5.1)
Sodium: 139 mmol/L (ref 135–145)

## 2018-12-12 LAB — CBG MONITORING, ED: GLUCOSE-CAPILLARY: 100 mg/dL — AB (ref 70–99)

## 2018-12-12 LAB — CBC
HCT: 38 % (ref 36.0–46.0)
Hemoglobin: 12 g/dL (ref 12.0–15.0)
MCH: 29.1 pg (ref 26.0–34.0)
MCHC: 31.6 g/dL (ref 30.0–36.0)
MCV: 92 fL (ref 80.0–100.0)
PLATELETS: 178 10*3/uL (ref 150–400)
RBC: 4.13 MIL/uL (ref 3.87–5.11)
RDW: 12.3 % (ref 11.5–15.5)
WBC: 6.1 10*3/uL (ref 4.0–10.5)
nRBC: 0 % (ref 0.0–0.2)

## 2018-12-12 MED ORDER — BUMETANIDE 2 MG PO TABS
2.0000 mg | ORAL_TABLET | Freq: Every day | ORAL | Status: DC
  Administered 2018-12-13 – 2018-12-18 (×6): 2 mg via ORAL
  Filled 2018-12-12 (×6): qty 1

## 2018-12-12 MED ORDER — FERROUS SULFATE 325 (65 FE) MG PO TABS
325.0000 mg | ORAL_TABLET | Freq: Every day | ORAL | Status: DC
  Administered 2018-12-13 – 2018-12-18 (×6): 325 mg via ORAL
  Filled 2018-12-12 (×7): qty 1

## 2018-12-12 MED ORDER — CARVEDILOL 12.5 MG PO TABS
25.0000 mg | ORAL_TABLET | Freq: Two times a day (BID) | ORAL | Status: DC
  Administered 2018-12-13 (×2): 25 mg via ORAL
  Filled 2018-12-12 (×3): qty 2

## 2018-12-12 MED ORDER — ADULT MULTIVITAMIN W/MINERALS CH
1.0000 | ORAL_TABLET | Freq: Every day | ORAL | Status: DC
  Administered 2018-12-13 – 2018-12-18 (×6): 1 via ORAL
  Filled 2018-12-12 (×6): qty 1

## 2018-12-12 MED ORDER — LORAZEPAM 2 MG/ML IJ SOLN
0.5000 mg | Freq: Once | INTRAMUSCULAR | Status: AC
  Administered 2018-12-12: 0.5 mg via INTRAVENOUS
  Filled 2018-12-12: qty 1

## 2018-12-12 MED ORDER — MAGNESIUM OXIDE 400 (241.3 MG) MG PO TABS
400.0000 mg | ORAL_TABLET | Freq: Two times a day (BID) | ORAL | Status: DC
  Administered 2018-12-13 – 2018-12-18 (×11): 400 mg via ORAL
  Filled 2018-12-12 (×14): qty 1

## 2018-12-12 MED ORDER — ACETAMINOPHEN 650 MG RE SUPP: 650.0000 mg | RECTAL | Status: DC | PRN

## 2018-12-12 MED ORDER — LOSARTAN POTASSIUM 50 MG PO TABS
50.0000 mg | ORAL_TABLET | Freq: Two times a day (BID) | ORAL | Status: DC
  Administered 2018-12-13 – 2018-12-18 (×11): 50 mg via ORAL
  Filled 2018-12-12 (×12): qty 1

## 2018-12-12 MED ORDER — DILTIAZEM HCL ER COATED BEADS 120 MG PO CP24
120.0000 mg | ORAL_CAPSULE | Freq: Every day | ORAL | Status: DC
  Administered 2018-12-13: 120 mg via ORAL
  Filled 2018-12-12 (×2): qty 1

## 2018-12-12 MED ORDER — ACETAMINOPHEN 325 MG PO TABS
650.0000 mg | ORAL_TABLET | ORAL | Status: DC | PRN
  Administered 2018-12-15: 650 mg via ORAL
  Filled 2018-12-12: qty 2

## 2018-12-12 MED ORDER — VITAMIN B-12 1000 MCG PO TABS
ORAL_TABLET | Freq: Every day | ORAL | Status: DC
  Administered 2018-12-13 – 2018-12-18 (×6): 1000 ug via ORAL
  Filled 2018-12-12 (×5): qty 1

## 2018-12-12 MED ORDER — POTASSIUM CHLORIDE CRYS ER 10 MEQ PO TBCR
10.0000 meq | EXTENDED_RELEASE_TABLET | Freq: Two times a day (BID) | ORAL | Status: DC
  Administered 2018-12-13 – 2018-12-18 (×10): 10 meq via ORAL
  Filled 2018-12-12 (×18): qty 1

## 2018-12-12 MED ORDER — ALPRAZOLAM 0.5 MG PO TABS
0.5000 mg | ORAL_TABLET | Freq: Two times a day (BID) | ORAL | Status: DC
  Administered 2018-12-13 – 2018-12-18 (×10): 0.5 mg via ORAL
  Filled 2018-12-12 (×11): qty 1

## 2018-12-12 MED ORDER — ASPIRIN 325 MG PO TABS
325.0000 mg | ORAL_TABLET | Freq: Every day | ORAL | Status: DC
  Filled 2018-12-12: qty 1

## 2018-12-12 MED ORDER — ACETAMINOPHEN 160 MG/5ML PO SOLN: 650.0000 mg | ORAL | Status: DC | PRN

## 2018-12-12 MED ORDER — OCUVITE-LUTEIN PO CAPS
1.0000 | ORAL_CAPSULE | Freq: Every day | ORAL | Status: DC
  Filled 2018-12-12: qty 1

## 2018-12-12 MED ORDER — STROKE: EARLY STAGES OF RECOVERY BOOK
Freq: Once | Status: DC
  Filled 2018-12-12: qty 1

## 2018-12-12 NOTE — ED Triage Notes (Signed)
Pt endorses episode of verigo this morning around 0830 when she woke up, tried to get out of bed and arms and legs started shaking and jerking bilaterally. Pt tried to get to bathroom but needed assistance walking. Pt's daughter noticed the jerking motion in both arms as well. THis is no longer present, pt took meclizine and xanax with relief. Pt's vertigo has improved. VSS, NIH 0. LSN last night at 2200

## 2018-12-12 NOTE — ED Notes (Signed)
Hospitalist bedside 

## 2018-12-12 NOTE — ED Notes (Signed)
Patient transported to MRI 

## 2018-12-12 NOTE — ED Provider Notes (Signed)
[facility] EMERGENCY DEPARTMENT Provider Note   CSN: [id number] Arrival date & time: 12/12/18  1319     History   Chief Complaint Chief Complaint  Patient presents with  . Dizziness  . arm and leg jerks    HPI Kayla Contreras is a 84 y.o. female with a past medical history of atrial fibrillation currently anticoagulated on Eliquis, diabetes, who presents to ED for 8-hour history of vertigo, feeling of "off balance," jerking of her extremities.  States that she woke up this morning with the symptoms.  She felt like her vertigo flared up when she woke up.  When she tried to get out of bed she noted that her legs were moving on their own.  She tried to get up and walk to the bathroom but needed assistance because her cane was jerking as well.  She took one Xanax and then a meclizine with improvement in her symptoms.  She then again felt vertigo while waiting in the waiting room approximately 1 hour ago and then took another meclizine.  She has had blurry vision since then.  She also reports pain throughout her sinuses.  She states that her vertigo will sometimes flareup when she gets a sinus infection.  She was treated for a sinus infection last month.  Denies any numbness in arms or legs but does state this morning she felt like she couldn't move her right leg and her left arm was numb; denies injuries or falls, vomiting, fever, shortness of breath. States that she can also feel like her heart is fluttering.  Denies any chest pain.  HPI  Past Medical History:  Diagnosis Date  . Atrial fibrillation ([place])     There are no active problems to display for this patient.   Past Surgical History:  Procedure Laterality Date  . CARDIAC CATHETERIZATION       OB History   No obstetric history on file.      Home Medications    Prior to Admission medications   Medication Sig Start Date End Date Taking? Authorizing Provider  ALPRAZolam Duanne Moron) 0.5 MG tablet Take 0.5 mg  by mouth 2 (two) times daily.  02/10/17  Yes [provider]  apixaban (ELIQUIS) 2.5 MG TABS tablet Take 2.5 mg by mouth 2 (two) times daily.    Yes [provider]  bumetanide (BUMEX) 2 MG tablet Take 2 mg by mouth daily.  12/23/16  Yes [provider]  carvedilol (COREG) 25 MG tablet Take 25 mg by mouth 2 (two) times daily. 12/23/16  Yes [provider]  Cyanocobalamin (VITAMIN B-12) 5000 MCG SUBL Place 5,000 Units under the tongue daily.    Yes [provider]  diltiazem (DILT-XR) 120 MG 24 hr capsule Take 120 mg by mouth daily.  01/13/17  Yes [provider]  esomeprazole (NEXIUM) 40 MG capsule Take 40 mg by mouth daily at 12 noon.   Yes [provider]  ferrous sulfate 325 (65 FE) MG tablet Take 325 mg by mouth daily with breakfast.   Yes [provider]  losartan (COZAAR) 50 MG tablet Take 50 mg by mouth 2 (two) times daily. 01/08/17  Yes [provider]  magnesium oxide (MAG-OX) 400 MG tablet Take 400 mg by mouth 2 (two) times daily.   Yes [provider]  metFORMIN (GLUCOPHAGE) 500 MG tablet Take 1,000 mg by mouth 2 (two) times daily.  02/22/17  Yes [provider]  Multiple Vitamins-Minerals (CENTRUM SILVER 50+WOMEN) TABS  Take 1 tablet by mouth daily.   Yes [provider]  Multiple Vitamins-Minerals (PRESERVISION AREDS 2+MULTI VIT) CAPS Take 1 tablet by mouth daily at 12 noon.   Yes [provider]  potassium chloride (KLOR-CON) 8 MEQ tablet Take 8 mEq by mouth 2 (two) times daily. 02/11/17  Yes [provider]  ondansetron (ZOFRAN ODT) 4 MG disintegrating tablet Take 1 tablet (4 mg total) by mouth every 8 (eight) hours as needed for nausea or vomiting. Patient not taking: Reported on 12/12/2018 09/03/18   Irven Baltimore, MD    Family History No family history on file.  Social History Social History   Tobacco Use  . Smoking status: Never Smoker  . Smokeless  tobacco: Never Used  Substance Use Topics  . Alcohol use: Not on file  . Drug use: Not on file     Allergies   Other   Review of Systems Review of Systems  Constitutional: Negative for appetite change, chills and fever.  HENT: Positive for sinus pressure and sinus pain. Negative for ear pain, rhinorrhea, sneezing and sore throat.   Eyes: Negative for photophobia and visual disturbance.  Respiratory: Negative for cough, chest tightness, shortness of breath and wheezing.   Cardiovascular: Negative for chest pain and palpitations.  Gastrointestinal: Negative for abdominal pain, blood in stool, constipation, diarrhea, nausea and vomiting.  Genitourinary: Negative for dysuria, hematuria and urgency.  Musculoskeletal: Negative for myalgias.  Skin: Negative for rash.  Neurological: Positive for dizziness, tremors, numbness and headaches. Negative for speech difficulty, weakness and light-headedness.     Physical Exam Updated Vital Signs BP (!) 159/83   Pulse 77   Temp 97.7 F (36.5 C) (Oral)   Resp 17   SpO2 100%   Physical Exam Vitals signs and nursing note reviewed.  Constitutional:      General: She is not in acute distress.    Appearance: She is well-developed.     Comments: Nontoxic appearing and in no acute distress. No jerking noted.  HENT:     Head: Normocephalic and atraumatic.     Nose:     Right Sinus: Maxillary sinus tenderness and frontal sinus tenderness present.     Left Sinus: Maxillary sinus tenderness and frontal sinus tenderness present.  Eyes:     General: No scleral icterus.       Right eye: No discharge.        Left eye: No discharge.     Conjunctiva/sclera: Conjunctivae normal.     Pupils: Pupils are equal, round, and reactive to light.  Neck:     Musculoskeletal: Normal range of motion and neck supple.  Cardiovascular:     Rate and Rhythm: Normal rate. Rhythm irregular.     Heart sounds: Normal heart sounds. No murmur. No friction rub. No  gallop.   Pulmonary:     Effort: Pulmonary effort is normal. No respiratory distress.     Breath sounds: Normal breath sounds.  Abdominal:     General: Bowel sounds are normal. There is no distension.     Palpations: Abdomen is soft.     Tenderness: There is no abdominal tenderness. There is no guarding.  Musculoskeletal: Normal range of motion.  Skin:    General: Skin is warm and dry.     Findings: No rash.  Neurological:     Mental Status: She is alert and oriented to person, place, and time.     Cranial Nerves: No cranial nerve deficit.     Sensory:  No sensory deficit.     Motor: No weakness or abnormal muscle tone.     Coordination: Coordination normal.     Comments: Pupils reactive. No facial asymmetry noted. Cranial nerves appear grossly intact. Sensation intact to light touch on face, BUE and BLE. Strength 5/5 in BUE and BLE. Normal finger to nose coordination bilaterally.      ED Treatments / Results  Labs (all labs ordered are listed, but only abnormal results are displayed) Labs Reviewed  BASIC METABOLIC PANEL - Abnormal; Notable for the following components:      Result Value   Glucose, Bld 211 (*)    Creatinine, Ser 1.02 (*)    GFR calc non Af Amer 50 (*)    GFR calc Af Amer 58 (*)    All other components within normal limits  URINALYSIS, ROUTINE W REFLEX MICROSCOPIC - Abnormal; Notable for the following components:   Color, Urine STRAW (*)    Specific Gravity, Urine 1.003 (*)    Glucose, UA 50 (*)    All other components within normal limits  CBG MONITORING, ED - Abnormal; Notable for the following components:   Glucose-Capillary 100 (*)    All other components within normal limits  CBC    EKG EKG Interpretation  Date/Time:  Tuesday December 12 2018 13:32:24 EST Ventricular Rate:  81 PR Interval:    QRS Duration: 122 QT Interval:  410 QTC Calculation: 476 R Axis:   -110 Text Interpretation:  Atrial fibrillation Right superior axis deviation  Anteroseptal infarct , age undetermined No significant change since last tracing Confirmed by Blanchie Dessert [phone]) on 12/12/2018 3:53:10 PM   Radiology Ct Head Wo Contrast  Result Date: 12/12/2018 CLINICAL DATA:  84 year old female with episode of vertigo this morning upon waking. Initial encounter. EXAM: CT HEAD WITHOUT CONTRAST TECHNIQUE: Contiguous axial images were obtained from the base of the skull through the vertex without intravenous contrast. COMPARISON:  None. FINDINGS: Brain: Small acute inferior left cerebellar (posterior inferior cerebellar artery distribution) infarct. Secondary to streak artifact from calvarium its difficult to evaluate for the possibility of tiny amount of blood. No obvious blood identified. Chronic microvascular changes. Remote small left basal ganglia infarct. No intracranial mass lesion noted on this unenhanced exam. Vascular: Atherosclerotic changes vertebral arteries and carotid arteries. No acute hyperdense vessel. Skull: No acute abnormality. Sinuses/Orbits: Post lens replacement. Moderate mucosal thickening/opacification right sphenoid sinus. Minimal mucosal thickening left sphenoid sinus. Other: Mastoid air cells and middle ear cavities are clear. IMPRESSION: 1. Small acute inferior left cerebellar (posterior inferior cerebellar artery distribution) infarct. Secondary to streak artifact from calvarium its difficult to evaluate for the possibility of tiny amount of blood. No obvious blood identified. 2. Chronic microvascular changes. Remote small left basal ganglia infarct. 3. Moderate mucosal thickening/opacification right sphenoid sinus. Minimal mucosal thickening left sphenoid sinus. These results were called by telephone at the time of interpretation on 12/12/2018 at 5:35 pm to [facility] , who verbally acknowledged these results. Electronically Signed   By: Genia Del M.D.   On: 12/12/2018 17:40    Procedures Procedures (including critical care  time)  Medications Ordered in ED Medications - No data to display   Initial Impression / Assessment and Plan / ED Course  I have reviewed the triage vital signs and the nursing notes.  Pertinent labs & imaging results that were available during my care of the patient were reviewed by me and considered in my medical decision making (see chart for details).  84 year old female with a past medical history of A. fib currently anticoagulated on Eliquis, diabetes presents to ED for 8-hour history of vertigo, feeling "off balance," and jerking of extremities.  She woke up with the symptoms 8 hours ago.  She felt like she had sinus pain and pressure which caused her vertigo to flareup.  She tried to get out of bed but then noticed that her legs were jerking/spasming on their own.  She also had trouble walking because her arms were spasming.  She took her regularly scheduled dose of Xanax and meclizine with improvement in her symptoms.  While she was in the waiting room she again had the feeling of feeling off balance and then took another meclizine.  She has apparently had blurry vision since then 2.  States that her meclizine will flareup with a sinus infection from time to time.  On exam there are no deficits on neurological exam noted.  No jerking or spasming of extremities noted.  She is alert and oriented x4.  No facial asymmetry noted.  No sensory deficit noted.  She denies any chest pain but feels like her heart is fluttering.  Plan is to obtain lab work, CT of the head and reassess.  EKG shows atrial fibrillation which was present in prior EKG.  CBC, BMP, urinalysis is unremarkable.  CT of the head shows small acute inferior left cerebellar infarct.  Difficult to determine if there is a small amount of blood present.  Will consult neurology and admit to medicine. Patient discussed with and seen by my attending, Dr. Maryan Rued.    Portions of this note were generated with Theatre manager. Dictation errors may occur despite best attempts at proofreading.   Final Clinical Impressions(s) / ED Diagnoses   Final diagnoses:  None    ED Discharge Orders    None       Delia Heady, PA-C 12/12/18 1818    Blanchie Dessert, MD 12/13/18 1910

## 2018-12-12 NOTE — H&P (Addendum)
History and Physical    Lumi Winslett UJW:[id number] DOB: 1932-10-14 DOA: 12/12/2018  PCP: Haywood Pao, MD  Patient coming from: home  Chief Complaint:  Unsteady gait  HPI: Nekeisha Aure is a 84 y.o. female with medical history significant of A. fib on Eliquis comes in with unsteady gait since 8:00 this morning.  Patient has vertigo and takes meclizine at home.  But this felt much different than her routine vertigo she denies any fevers.  She did have a sinus cold recently.  She denies any weakness in any of her extremities.  No numbness or tingling.  No slurred speech or drooling.  Patient is being referred for admission for acute infarct shown on CAT scan.  She is compliant with her Eliquis she takes no other aspirin products.  Patient is being referred for admission for acute stroke.  She still reports that she feels like her limbs are heavy still.  Review of Systems: As per HPI otherwise 10 point review of systems negative.   Past Medical History:  Diagnosis Date  . Atrial fibrillation [facility])     Past Surgical History:  Procedure Laterality Date  . CARDIAC CATHETERIZATION       reports that she has never smoked. She has never used smokeless tobacco. No history on file for alcohol and drug.  Allergies  Allergen Reactions  . Other Other (See Comments)    Steroids Head hurts and gives her the shakes    No family history on file. no premature CAD  Prior to Admission medications   Medication Sig Start Date End Date Taking? Authorizing Provider  ALPRAZolam Duanne Moron) 0.5 MG tablet Take 0.5 mg by mouth 2 (two) times daily.  02/10/17  Yes [provider]  apixaban (ELIQUIS) 2.5 MG TABS tablet Take 2.5 mg by mouth 2 (two) times daily.    Yes [provider]  bumetanide (BUMEX) 2 MG tablet Take 2 mg by mouth daily.  12/23/16  Yes [provider]  carvedilol (COREG) 25 MG tablet Take 25 mg by mouth 2 (two) times daily. 12/23/16  Yes [provider]  Cyanocobalamin (VITAMIN B-12) 5000 MCG SUBL Place 5,000 Units under the tongue daily.    Yes [provider]  diltiazem (DILT-XR) 120 MG 24 hr capsule Take 120 mg by mouth daily.  01/13/17  Yes [provider]  esomeprazole (NEXIUM) 40 MG capsule Take 40 mg by mouth daily at 12 noon.   Yes [provider]  ferrous sulfate 325 (65 FE) MG tablet Take 325 mg by mouth daily with breakfast.   Yes [provider]  losartan (COZAAR) 50 MG tablet Take 50 mg by mouth 2 (two) times daily. 01/08/17  Yes [provider]  magnesium oxide (MAG-OX) 400 MG tablet Take 400 mg by mouth 2 (two) times daily.   Yes [provider]  metFORMIN (GLUCOPHAGE) 500 MG tablet Take 1,000 mg by mouth 2 (two) times daily.  02/22/17  Yes [provider]  Multiple Vitamins-Minerals (CENTRUM SILVER 50+WOMEN) TABS Take 1 tablet by mouth daily.   Yes [provider]  Multiple Vitamins-Minerals (PRESERVISION AREDS 2+MULTI VIT) CAPS Take 1 tablet by mouth daily at 12 noon.   Yes [provider]  potassium chloride (KLOR-CON) 8 MEQ tablet Take 8 mEq by mouth 2 (two) times daily. 02/11/17  Yes [provider]  ondansetron (ZOFRAN ODT) 4 MG disintegrating tablet Take 1 tablet (4 mg total) by mouth every 8 (eight) hours as needed for nausea  or vomiting. Patient not taking: Reported on 12/12/2018 09/03/18   Irven Baltimore, MD    Physical Exam: Vitals:   12/12/18 1536 12/12/18 1600 12/12/18 1800 12/12/18 1815  BP:  (!) 159/83 (!) 177/86 (!) 183/92  Pulse: 85 77 87 93  Resp: 16 17 14 12   Temp:      TempSrc:      SpO2: 99% 100% 100% 99%      Constitutional: NAD, calm, comfortable Vitals:   12/12/18 1536 12/12/18 1600 12/12/18 1800 12/12/18 1815  BP:  (!) 159/83 (!) 177/86 (!) 183/92  Pulse: 85 77 87 93  Resp: 16 17 14 12   Temp:      TempSrc:      SpO2: 99% 100% 100% 99%   Eyes: PERRL, lids and conjunctivae normal ENMT:  Mucous membranes are moist. Posterior pharynx clear of any exudate or lesions.Normal dentition.  Neck: normal, supple, no masses, no thyromegaly Respiratory: clear to auscultation bilaterally, no wheezing, no crackles. Normal respiratory effort. No accessory muscle use.  Cardiovascular: Regular rate and rhythm, no murmurs / rubs / gallops. No extremity edema. 2+ pedal pulses. No carotid bruits.  Abdomen: no tenderness, no masses palpated. No hepatosplenomegaly. Bowel sounds positive.  Musculoskeletal: no clubbing / cyanosis. No joint deformity upper and lower extremities. Good ROM, no contractures. Normal muscle tone.  Skin: no rashes, lesions, ulcers. No induration Neurologic: CN 2-12 grossly intact. Sensation intact, DTR normal. Strength 5/5 in all 4.  Psychiatric: Normal judgment and insight. Alert and oriented x 3. Normal mood.    Labs on Admission: I have personally reviewed following labs and imaging studies  CBC: Recent Labs  Lab 12/12/18 1346  WBC 6.1  HGB 12.0  HCT 38.0  MCV 92.0  PLT 211   Basic Metabolic Panel: Recent Labs  Lab 12/12/18 1346  NA 139  K 3.9  CL 103  CO2 23  GLUCOSE 211*  BUN 18  CREATININE 1.02*  CALCIUM 9.3   GFR: CrCl cannot be calculated (Unknown ideal weight.). Liver Function Tests: No results for input(s): AST, ALT, ALKPHOS, BILITOT, PROT, ALBUMIN in the last 168 hours. No results for input(s): LIPASE, AMYLASE in the last 168 hours. No results for input(s): AMMONIA in the last 168 hours. Coagulation Profile: No results for input(s): INR, PROTIME in the last 168 hours. Cardiac Enzymes: No results for input(s): CKTOTAL, CKMB, CKMBINDEX, TROPONINI in the last 168 hours. BNP (last 3 results) No results for input(s): PROBNP in the last 8760 hours. HbA1C: No results for input(s): HGBA1C in the last 72 hours. CBG: Recent Labs  Lab 12/12/18 1701  GLUCAP 100*   Lipid Profile: No results for input(s): CHOL, HDL, LDLCALC, TRIG, CHOLHDL,  LDLDIRECT in the last 72 hours. Thyroid Function Tests: No results for input(s): TSH, T4TOTAL, FREET4, T3FREE, THYROIDAB in the last 72 hours. Anemia Panel: No results for input(s): VITAMINB12, FOLATE, FERRITIN, TIBC, IRON, RETICCTPCT in the last 72 hours. Urine analysis:    Component Value Date/Time   COLORURINE STRAW (A) 12/12/2018 1344   APPEARANCEUR CLEAR 12/12/2018 1344   LABSPEC 1.003 (L) 12/12/2018 1344   PHURINE 7.0 12/12/2018 1344   GLUCOSEU 50 (A) 12/12/2018 1344   HGBUR NEGATIVE 12/12/2018 1344   BILIRUBINUR NEGATIVE 12/12/2018 1344   KETONESUR NEGATIVE 12/12/2018 1344   PROTEINUR NEGATIVE 12/12/2018 1344   NITRITE NEGATIVE 12/12/2018 1344   LEUKOCYTESUR NEGATIVE 12/12/2018 1344   Sepsis Labs: !!!!!!!!!!!!!!!!!!!!!!!!!!!!!!!!!!!!!!!!!!!! @LABRCNTIP (procalcitonin:4,lacticidven:4) )No results found for this or any previous visit (from the past 240 hour(s)).  Radiological Exams on Admission: Ct Head Wo Contrast  Result Date: 12/12/2018 CLINICAL DATA:  84 year old female with episode of vertigo this morning upon waking. Initial encounter. EXAM: CT HEAD WITHOUT CONTRAST TECHNIQUE: Contiguous axial images were obtained from the base of the skull through the vertex without intravenous contrast. COMPARISON:  None. FINDINGS: Brain: Small acute inferior left cerebellar (posterior inferior cerebellar artery distribution) infarct. Secondary to streak artifact from calvarium its difficult to evaluate for the possibility of tiny amount of blood. No obvious blood identified. Chronic microvascular changes. Remote small left basal ganglia infarct. No intracranial mass lesion noted on this unenhanced exam. Vascular: Atherosclerotic changes vertebral arteries and carotid arteries. No acute hyperdense vessel. Skull: No acute abnormality. Sinuses/Orbits: Post lens replacement. Moderate mucosal thickening/opacification right sphenoid sinus. Minimal mucosal thickening left sphenoid sinus. Other:  Mastoid air cells and middle ear cavities are clear. IMPRESSION: 1. Small acute inferior left cerebellar (posterior inferior cerebellar artery distribution) infarct. Secondary to streak artifact from calvarium its difficult to evaluate for the possibility of tiny amount of blood. No obvious blood identified. 2. Chronic microvascular changes. Remote small left basal ganglia infarct. 3. Moderate mucosal thickening/opacification right sphenoid sinus. Minimal mucosal thickening left sphenoid sinus. These results were called by telephone at the time of interpretation on 12/12/2018 at 5:35 pm to [facility] , who verbally acknowledged these results. Electronically Signed   By: Genia Del M.D.   On: 12/12/2018 17:40    EKG: Independently reviewed. afib Old chart reviewed Case discussed with edp  Assessment/Plan 84 year old female with acute stroke Principal Problem:   Stroke (HCC)-question whether CAT scan shows any a component of hemorrhage.  Hold Eliquis at this time.  Neurology consulted for further recommendations.  Allow permissive hypertension.  Obtain MRI brain.  Obtain carotid Dopplers.  Obtain cardiac echo.  Frequent neurological checks.  Speech therapy physical therapy and Occupational Therapy ordered.  Observe overnight on telemetry.  Follow-up on neurology recommendations.  Active Problems:   Atrial fibrillation (HCC)-currently rate controlled.  Holding Eliquis for the above reasons secondary to question hemorrhagic component to stroke.     DVT prophylaxis: scds Code Status: full Family Communication: dtr Disposition Plan: tomorrow Consults called: neuro Admission status: observation   Trygve Thal A MD [facility]  If 7PM-7AM, please contact night-coverage [email or web address] Password [facility]  12/12/2018, 7:06 PM   Spoke to dr Rory Percy with neurology, not hemorrhagic.  Hold eloquis and place on aspirin 325mg  daily for 3 days

## 2018-12-12 NOTE — ED Notes (Addendum)
Pt transported to MRI. Pt still very anxious and unsure of being able to endure exam, but agreeing to transport at this time.

## 2018-12-12 NOTE — Consult Note (Signed)
Neurology Consultation  Reason for Consult: Vertigo, CT suggestive of cerebellar stroke Referring Physician: Dr. Shanon Brow  CC: Vertigo  History is obtained from: Patient  HPI: Kayla Contreras is a 84 y.o. female past medical history of atrial fibrillation on Eliquis, BPPV in the past, anxiety, essential tremor presented for evaluation of vertigo that she started noticing around 5 AM when she woke up to go to the bathroom.  She felt her gait was unsteady and her legs were shaking when she attempted to walk.  She went to the bathroom and then came back and slept in bed.  She woke up again at 8 AM and the vertigo continued.  She described the vertigo as unsteadiness and lightheadedness. She is compliant to her Eliquis. She denies any chest pain nausea vomiting shortness of breath.  Denies any tingling numbness. She has not had a stroke in the past. She sees Dr. Pradeep-cardiology-notes not accessible. Locally, she sees cardiologist Dr. Virgina Jock, but unfortunately I was not able to pull any of his notes in the chart.  LKW: 5 AM on 12/12/2018 tpa given?:  Outside the window Premorbid modified Rankin scale (mRS): 1 for some movement restriction due to back pain  ROS:ROS was performed and is negative except as noted in the HPI.    Past Medical History:  Diagnosis Date  . Atrial fibrillation ([place])    No family history on file.   Social History:   reports that she has never smoked. She has never used smokeless tobacco. No history on file for alcohol and drug.  Medications  Current Facility-Administered Medications:  .   stroke: mapping our early stages of recovery book, , Does not apply, Once, Phillips Grout, MD .  acetaminophen (TYLENOL) tablet 650 mg, 650 mg, Oral, Q4H PRN **OR** acetaminophen (TYLENOL) solution 650 mg, 650 mg, Per Tube, Q4H PRN **OR** acetaminophen (TYLENOL) suppository 650 mg, 650 mg, Rectal, Q4H PRN, Phillips Grout, MD .  ALPRAZolam Duanne Moron) tablet 0.5 mg, 0.5 mg,  Oral, BID, Derrill Kay A, MD .  bumetanide (BUMEX) tablet 2 mg, 2 mg, Oral, Daily, Derrill Kay A, MD .  carvedilol (COREG) tablet 25 mg, 25 mg, Oral, BID, Derrill Kay A, MD .  CENTRUM SILVER 50+WOMEN TABS 1 tablet, 1 tablet, Oral, Daily, Derrill Kay A, MD .  diltiazem (DILACOR XR) 24 hr capsule 120 mg, 120 mg, Oral, Daily, Derrill Kay A, MD .  Derrill Memo ON 12/13/2018] ferrous sulfate tablet 325 mg, 325 mg, Oral, Q breakfast, Derrill Kay A, MD .  losartan (COZAAR) tablet 50 mg, 50 mg, Oral, BID, Derrill Kay A, MD .  magnesium oxide (MAG-OX) tablet 400 mg, 400 mg, Oral, BID, David, Rachal A, MD .  potassium chloride (KLOR-CON) CR tablet 8 mEq, 8 mEq, Oral, BID, Phillips Grout, MD .  Derrill Memo ON 12/13/2018] PRESERVISION AREDS 2+MULTI VIT CAPS 1 tablet, 1 tablet, Oral, Q1200, Phillips Grout, MD .  Vitamin B-12 SUBL, , Sublingual, Daily, Phillips Grout, MD  Current Outpatient Medications:  .  ALPRAZolam (XANAX) 0.5 MG tablet, Take 0.5 mg by mouth 2 (two) times daily. , Disp: , Rfl:  .  apixaban (ELIQUIS) 2.5 MG TABS tablet, Take 2.5 mg by mouth 2 (two) times daily. , Disp: , Rfl:  .  bumetanide (BUMEX) 2 MG tablet, Take 2 mg by mouth daily. , Disp: , Rfl:  .  carvedilol (COREG) 25 MG tablet, Take 25 mg by mouth 2 (two) times daily., Disp: , Rfl:  .  Cyanocobalamin (  VITAMIN B-12) 5000 MCG SUBL, Place 5,000 Units under the tongue daily. , Disp: , Rfl:  .  diltiazem (DILT-XR) 120 MG 24 hr capsule, Take 120 mg by mouth daily. , Disp: , Rfl:  .  esomeprazole (NEXIUM) 40 MG capsule, Take 40 mg by mouth daily at 12 noon., Disp: , Rfl:  .  ferrous sulfate 325 (65 FE) MG tablet, Take 325 mg by mouth daily with breakfast., Disp: , Rfl:  .  losartan (COZAAR) 50 MG tablet, Take 50 mg by mouth 2 (two) times daily., Disp: , Rfl:  .  magnesium oxide (MAG-OX) 400 MG tablet, Take 400 mg by mouth 2 (two) times daily., Disp: , Rfl:  .  metFORMIN (GLUCOPHAGE) 500 MG tablet, Take 1,000 mg by mouth 2 (two)  times daily. , Disp: , Rfl:  .  Multiple Vitamins-Minerals (CENTRUM SILVER 50+WOMEN) TABS, Take 1 tablet by mouth daily., Disp: , Rfl:  .  Multiple Vitamins-Minerals (PRESERVISION AREDS 2+MULTI VIT) CAPS, Take 1 tablet by mouth daily at 12 noon., Disp: , Rfl:  .  potassium chloride (KLOR-CON) 8 MEQ tablet, Take 8 mEq by mouth 2 (two) times daily., Disp: , Rfl:  .  ondansetron (ZOFRAN ODT) 4 MG disintegrating tablet, Take 1 tablet (4 mg total) by mouth every 8 (eight) hours as needed for nausea or vomiting. (Patient not taking: Reported on 12/12/2018), Disp: 20 tablet, Rfl: 0   Exam: Current vital signs: BP (!) 181/85   Pulse 88   Temp 97.7 F (36.5 C) (Oral)   Resp 14   SpO2 100%  Vital signs in last 24 hours: Temp:  [97.7 F (36.5 C)] 97.7 F (36.5 C) (12/17 1328) Pulse Rate:  [77-96] 88 (12/17 1919) Resp:  [10-18] 14 (12/17 1919) BP: (159-183)/(80-92) 181/85 (12/17 1919) SpO2:  [97 %-100 %] 100 % (12/17 1919)  GENERAL: Awake, alert in NAD HEENT: - Normocephalic and atraumatic, dry mm, no LN++, no Thyromegally LUNGS - Clear to auscultation bilaterally with no wheezes CV - S1S2 RRR, no m/r/g, equal pulses bilaterally. ABDOMEN - Soft, nontender, nondistended with normoactive BS Ext: warm, well perfused, intact peripheral pulses, no edema  NEURO:  Mental Status: AA&Ox3  Language: speech is non-dysarthric.  Naming, repetition, fluency, and comprehension intact. Cranial Nerves: PERRL EOMI, visual fields full, no facial asymmetry, facial sensation intact, hearing intact, tongue/uvula/soft palate midline, normal sternocleidomastoid and trapezius muscle strength. No evidence of tongue atrophy or fibrillations Motor: No vertical drifting any of the extremities, right lower extremity exam slightly limited by pain due to chronic back pain Tone: is normal and bulk is normal Sensation-decreased sensation to light touch on the left face, otherwise intact Coordination: FTN intact bilaterally,  no ataxia in BLE.  Has a baseline resting neck tremor consistent with her history of essential tremor Gait- deferred  NIHSS-1 for sensory   Labs I have reviewed labs in epic and the results pertinent to this consultation are:  CBC    Component Value Date/Time   WBC 6.1 12/12/2018 1346   RBC 4.13 12/12/2018 1346   HGB 12.0 12/12/2018 1346   HCT 38.0 12/12/2018 1346   PLT 178 12/12/2018 1346   MCV 92.0 12/12/2018 1346   MCH 29.1 12/12/2018 1346   MCHC 31.6 12/12/2018 1346   RDW 12.3 12/12/2018 1346    CMP     Component Value Date/Time   NA 139 12/12/2018 1346   K 3.9 12/12/2018 1346   CL 103 12/12/2018 1346   CO2 23 12/12/2018 1346  GLUCOSE 211 (H) 12/12/2018 1346   BUN 18 12/12/2018 1346   CREATININE 1.02 (H) 12/12/2018 1346   CALCIUM 9.3 12/12/2018 1346   PROT 6.4 (L) 09/02/2018 2239   ALBUMIN 4.0 09/02/2018 2239   AST 25 09/02/2018 2239   ALT 20 09/02/2018 2239   ALKPHOS 80 09/02/2018 2239   BILITOT 1.2 09/02/2018 2239   GFRNONAA 50 (L) 12/12/2018 1346   GFRAA 58 (L) 12/12/2018 1346   Imaging I have reviewed the images obtained:  CT-scan of the brain-subacute stroke in the left cerebellum.  Official radiology report with questionable bleed versus beam hardening artifact in the ischemic stroke.  Likely artifactual  Assessment:  84 year old woman with history of A. fib on anticoagulation with Eliquis with sudden onset of vertigo and gait ataxia with last known normal 5 AM on 12/12/2018 seen to have a subacute to late acute stroke in the left cerebellum on CT. Likely etiology cardioembolic. Need stroke work-up  Impression: Acute ischemic stroke-likely cardioembolic Atrial fibrillation  Recommendations: -Admit to telemetry -Frequent neuro checks -2D echocardiogram -Hold Eliquis -Aspirin 325 -Atorvastatin 80 -Hemoglobin A1c -Fasting blood panel -PT -OT -Speech therapy -N.p.o. until cleared by bedside swallow or speech therapy evaluation -Patient was  followed by the stroke team.  Further recommendations on anticoagulation in the future per the stroke team.  This would likely be considered as Eliquis failure and another newer oral anticoagulation agent might be considered going forward.  In either case, anticoagulation should not be started for at least 3 to 5 days from the stroke for the risk of hemorrhagic transformation. -MRI of the brain without contrast -CTA head and neck -Allow for permissive hypertension for the first 24 to 48 hours.  Treat only if systolic blood pressures over 220 on a as needed basis.   Please page stroke MD/NP on call with questions from 8 AM to 6 PM.  For after hours call the on-call neuro hospitalist.  -- Amie Portland, MD [facility] Pager: [phone] If 7pm to 7am, please call on call as listed on AMION.

## 2018-12-13 ENCOUNTER — Ambulatory Visit (HOSPITAL_BASED_OUTPATIENT_CLINIC_OR_DEPARTMENT_OTHER): Payer: Medicare Other

## 2018-12-13 ENCOUNTER — Other Ambulatory Visit: Payer: Self-pay

## 2018-12-13 ENCOUNTER — Encounter (HOSPITAL_COMMUNITY): Payer: Self-pay

## 2018-12-13 ENCOUNTER — Observation Stay (HOSPITAL_COMMUNITY): Payer: Medicare Other

## 2018-12-13 DIAGNOSIS — G8194 Hemiplegia, unspecified affecting left nondominant side: Secondary | ICD-10-CM | POA: Diagnosis present

## 2018-12-13 DIAGNOSIS — I2510 Atherosclerotic heart disease of native coronary artery without angina pectoris: Secondary | ICD-10-CM | POA: Diagnosis present

## 2018-12-13 DIAGNOSIS — Z79899 Other long term (current) drug therapy: Secondary | ICD-10-CM | POA: Diagnosis not present

## 2018-12-13 DIAGNOSIS — I4819 Other persistent atrial fibrillation: Secondary | ICD-10-CM | POA: Diagnosis present

## 2018-12-13 DIAGNOSIS — I4821 Permanent atrial fibrillation: Secondary | ICD-10-CM | POA: Diagnosis not present

## 2018-12-13 DIAGNOSIS — G250 Essential tremor: Secondary | ICD-10-CM | POA: Diagnosis present

## 2018-12-13 DIAGNOSIS — I63432 Cerebral infarction due to embolism of left posterior cerebral artery: Secondary | ICD-10-CM | POA: Diagnosis present

## 2018-12-13 DIAGNOSIS — I63532 Cerebral infarction due to unspecified occlusion or stenosis of left posterior cerebral artery: Secondary | ICD-10-CM

## 2018-12-13 DIAGNOSIS — Z7401 Bed confinement status: Secondary | ICD-10-CM | POA: Diagnosis not present

## 2018-12-13 DIAGNOSIS — Z7984 Long term (current) use of oral hypoglycemic drugs: Secondary | ICD-10-CM | POA: Diagnosis not present

## 2018-12-13 DIAGNOSIS — I771 Stricture of artery: Secondary | ICD-10-CM | POA: Diagnosis not present

## 2018-12-13 DIAGNOSIS — N179 Acute kidney failure, unspecified: Secondary | ICD-10-CM | POA: Diagnosis not present

## 2018-12-13 DIAGNOSIS — M549 Dorsalgia, unspecified: Secondary | ICD-10-CM | POA: Diagnosis present

## 2018-12-13 DIAGNOSIS — R001 Bradycardia, unspecified: Secondary | ICD-10-CM | POA: Diagnosis not present

## 2018-12-13 DIAGNOSIS — G8929 Other chronic pain: Secondary | ICD-10-CM | POA: Diagnosis present

## 2018-12-13 DIAGNOSIS — R29701 NIHSS score 1: Secondary | ICD-10-CM | POA: Diagnosis present

## 2018-12-13 DIAGNOSIS — F0150 Vascular dementia without behavioral disturbance: Secondary | ICD-10-CM | POA: Diagnosis not present

## 2018-12-13 DIAGNOSIS — G459 Transient cerebral ischemic attack, unspecified: Secondary | ICD-10-CM | POA: Diagnosis not present

## 2018-12-13 DIAGNOSIS — R4189 Other symptoms and signs involving cognitive functions and awareness: Secondary | ICD-10-CM | POA: Diagnosis not present

## 2018-12-13 DIAGNOSIS — G894 Chronic pain syndrome: Secondary | ICD-10-CM | POA: Diagnosis not present

## 2018-12-13 DIAGNOSIS — I10 Essential (primary) hypertension: Secondary | ICD-10-CM | POA: Diagnosis present

## 2018-12-13 DIAGNOSIS — I69398 Other sequelae of cerebral infarction: Secondary | ICD-10-CM | POA: Diagnosis not present

## 2018-12-13 DIAGNOSIS — M2550 Pain in unspecified joint: Secondary | ICD-10-CM | POA: Diagnosis not present

## 2018-12-13 DIAGNOSIS — I370 Nonrheumatic pulmonary valve stenosis: Secondary | ICD-10-CM | POA: Diagnosis not present

## 2018-12-13 DIAGNOSIS — I472 Ventricular tachycardia: Secondary | ICD-10-CM | POA: Diagnosis present

## 2018-12-13 DIAGNOSIS — I252 Old myocardial infarction: Secondary | ICD-10-CM | POA: Diagnosis not present

## 2018-12-13 DIAGNOSIS — R5383 Other fatigue: Secondary | ICD-10-CM | POA: Diagnosis not present

## 2018-12-13 DIAGNOSIS — I69351 Hemiplegia and hemiparesis following cerebral infarction affecting right dominant side: Secondary | ICD-10-CM | POA: Diagnosis not present

## 2018-12-13 DIAGNOSIS — I69321 Dysphasia following cerebral infarction: Secondary | ICD-10-CM | POA: Diagnosis not present

## 2018-12-13 DIAGNOSIS — G9340 Encephalopathy, unspecified: Secondary | ICD-10-CM | POA: Diagnosis present

## 2018-12-13 DIAGNOSIS — R278 Other lack of coordination: Secondary | ICD-10-CM | POA: Diagnosis not present

## 2018-12-13 DIAGNOSIS — I361 Nonrheumatic tricuspid (valve) insufficiency: Secondary | ICD-10-CM | POA: Diagnosis not present

## 2018-12-13 DIAGNOSIS — Z7901 Long term (current) use of anticoagulants: Secondary | ICD-10-CM

## 2018-12-13 DIAGNOSIS — R29810 Facial weakness: Secondary | ICD-10-CM | POA: Diagnosis present

## 2018-12-13 DIAGNOSIS — Z7982 Long term (current) use of aspirin: Secondary | ICD-10-CM | POA: Diagnosis not present

## 2018-12-13 DIAGNOSIS — I110 Hypertensive heart disease with heart failure: Secondary | ICD-10-CM | POA: Diagnosis not present

## 2018-12-13 DIAGNOSIS — I509 Heart failure, unspecified: Secondary | ICD-10-CM | POA: Diagnosis not present

## 2018-12-13 DIAGNOSIS — I6300 Cerebral infarction due to thrombosis of unspecified precerebral artery: Secondary | ICD-10-CM | POA: Diagnosis not present

## 2018-12-13 DIAGNOSIS — M6281 Muscle weakness (generalized): Secondary | ICD-10-CM | POA: Diagnosis not present

## 2018-12-13 DIAGNOSIS — E119 Type 2 diabetes mellitus without complications: Secondary | ICD-10-CM | POA: Diagnosis present

## 2018-12-13 DIAGNOSIS — I482 Chronic atrial fibrillation, unspecified: Secondary | ICD-10-CM | POA: Diagnosis not present

## 2018-12-13 DIAGNOSIS — Z741 Need for assistance with personal care: Secondary | ICD-10-CM | POA: Diagnosis not present

## 2018-12-13 DIAGNOSIS — E785 Hyperlipidemia, unspecified: Secondary | ICD-10-CM | POA: Diagnosis present

## 2018-12-13 DIAGNOSIS — I639 Cerebral infarction, unspecified: Secondary | ICD-10-CM | POA: Diagnosis not present

## 2018-12-13 DIAGNOSIS — H5500 Unspecified nystagmus: Secondary | ICD-10-CM | POA: Diagnosis present

## 2018-12-13 DIAGNOSIS — R42 Dizziness and giddiness: Secondary | ICD-10-CM | POA: Diagnosis not present

## 2018-12-13 LAB — HEMOGLOBIN A1C
Hgb A1c MFr Bld: 5.8 % — ABNORMAL HIGH (ref 4.8–5.6)
MEAN PLASMA GLUCOSE: 119.76 mg/dL

## 2018-12-13 LAB — ECHOCARDIOGRAM COMPLETE

## 2018-12-13 LAB — LIPID PANEL
Cholesterol: 174 mg/dL (ref 0–200)
HDL: 43 mg/dL (ref >40)
LDL Cholesterol: 113 mg/dL — ABNORMAL HIGH (ref 0–99)
TRIGLYCERIDES: 90 mg/dL (ref <150)
Total CHOL/HDL Ratio: 4 RATIO
VLDL: 18 mg/dL (ref 0–40)

## 2018-12-13 MED ORDER — PROSIGHT PO TABS
1.0000 | ORAL_TABLET | Freq: Every day | ORAL | Status: DC
  Administered 2018-12-13 – 2018-12-17 (×5): 1 via ORAL
  Filled 2018-12-13 (×6): qty 1

## 2018-12-13 MED ORDER — PERFLUTREN LIPID MICROSPHERE
1.0000 mL | INTRAVENOUS | Status: AC | PRN
  Administered 2018-12-13: 2 mL via INTRAVENOUS
  Filled 2018-12-13: qty 10

## 2018-12-13 MED ORDER — APIXABAN 2.5 MG PO TABS
2.5000 mg | ORAL_TABLET | Freq: Two times a day (BID) | ORAL | Status: DC
  Administered 2018-12-13 – 2018-12-18 (×11): 2.5 mg via ORAL
  Filled 2018-12-13 (×11): qty 1

## 2018-12-13 MED ORDER — ASPIRIN EC 81 MG PO TBEC
81.0000 mg | DELAYED_RELEASE_TABLET | Freq: Every day | ORAL | Status: DC
  Administered 2018-12-13 – 2018-12-18 (×5): 81 mg via ORAL
  Filled 2018-12-13 (×6): qty 1

## 2018-12-13 MED ORDER — IOPAMIDOL (ISOVUE-370) INJECTION 76%
75.0000 mL | Freq: Once | INTRAVENOUS | Status: AC | PRN
  Administered 2018-12-13: 75 mL via INTRAVENOUS

## 2018-12-13 MED ORDER — PRAVASTATIN SODIUM 40 MG PO TABS
40.0000 mg | ORAL_TABLET | Freq: Every day | ORAL | Status: DC
  Administered 2018-12-13 – 2018-12-18 (×6): 40 mg via ORAL
  Filled 2018-12-13 (×6): qty 1

## 2018-12-13 NOTE — Progress Notes (Signed)
Called by nursing staff for a patient difficult to arouse with noxious stimuli.  Dr. Rory Percy arrived to bedside.  Pt will respond slightly to sternal rub and trapezius squeeze.  Stat Head CT done.  Ativan 0.5mg  IV was given at 1925 for MRI.  Pt continues to be very sedated.  Will continue to monitor.

## 2018-12-13 NOTE — Progress Notes (Addendum)
[facility]                                                                              Patient Demographics  Kayla Contreras, is a 84 y.o. female, DOB - [birth year], JEH:[id number]  Admit date - 12/12/2018   Admitting Physician Phillips Grout, MD  Outpatient Primary MD for the patient is Tisovec, Fransico Him, MD  Outpatient specialists:   LOS - 0  days   Medical records reviewed and are as summarized below:    Chief Complaint  Patient presents with  . Dizziness  . arm and leg jerks       Brief summary    Kayla Contreras is a 84 y.o. female with medical history significant of A. fib on Eliquis comes in with unsteady gait since 8:00 this morning.  Patient has vertigo and takes meclizine at home.  But this felt much different than her routine vertigo she denies any fevers.  She did have a sinus cold recently.  She denies any weakness in any of her extremities.  No numbness or tingling.  No slurred speech or drooling.  Patient is being referred for admission for acute infarct shown on CAT scan.  She is compliant with her Eliquis she takes no other aspirin products.  Patient is being referred for admission for acute stroke.  She still reports that she feels like her limbs are heavy still  Assessment & Plan    Principal Problem: Acute CVA -In the setting of known A. fib, presented with acute vertigo, earlier this morning noted to be more somnolent, with right upper extremity drift, right facial droop and dysarthria. -MRI 12/17 showed left lateral pontine infarct, small vessel disease, old left and right cerebellar infarcts -Repeat CT head overnight showed no pontine edema unchanged -CTA head and neck no large vessel occlusion, occluded left PCA -2D echo showed EF of 45 to 50% with diffuse hypokinesis no PFO -Neurology consulted, recommended resume home Eliquis and add low-dose aspirin -Repeat MRI today showed interval treatment of acute infarction in the left greater  than right thalami, left posterior limb of internal capsule, left cerebral peduncle, left midbrain, left posterior hippocampus and left occipital lobe, no hemorrhage or mass-effect.  Some stable small acute/early subacute infarction in the left lateral pons -LDL 113, continue pravastatin -Hemoglobin A1c 5.8  Active Problems: Chronic atrial fibrillation (HCC) -Rate controlled, resumed Eliquis, aspirin, will follow recommendations from stroke service regarding anticoagulation  Code Status: Full code DVT Prophylaxis: Eliquis Family Communication: Discussed in detail with the patient, all imaging results, lab results explained to the patient's daughter at the bedside   Disposition Plan:   Time Spent in minutes  35   Procedures:  MRI brain, 2D echo  Consultants:   Neurology  Antimicrobials:      Medications  Scheduled Meds: .  stroke: mapping our early stages of recovery book   Does not apply Once  . ALPRAZolam  0.5 mg Oral BID  . apixaban  2.5 mg Oral BID  . aspirin EC  81 mg Oral Daily  . bumetanide  2 mg Oral Daily  . carvedilol  25 mg  Oral BID  . diltiazem  120 mg Oral Daily  . ferrous sulfate  325 mg Oral Q breakfast  . losartan  50 mg Oral BID  . magnesium oxide  400 mg Oral BID  . multivitamin  1 tablet Oral Q1200  . multivitamin with minerals  1 tablet Oral Daily  . potassium chloride  10 mEq Oral BID  . pravastatin  40 mg Oral q1800  . vitamin B-12   Oral Daily   Continuous Infusions: PRN Meds:.acetaminophen **OR** acetaminophen (TYLENOL) oral liquid 160 mg/5 mL **OR** acetaminophen, perflutren lipid microspheres (DEFINITY) IV suspension   Antibiotics   Anti-infectives (From admission, onward)   None        Subjective:   Kayla Contreras was seen and examined today.  Unable to obtain review of system from the patient, very somnolent, daughter at the bedside.  Objective:   Vitals:   12/13/18 0200 12/13/18 0329 12/13/18 0723 12/13/18 1111  BP: (!)  139/58 (!) 131/57 127/70 (!) 141/59  Pulse:  63 (!) 58 60  Resp: 16 16 16 18   Temp:  99.2 F (37.3 C) 99.3 F (37.4 C) 98.2 F (36.8 C)  TempSrc:  Oral Axillary Axillary  SpO2: 99% 99% 100% 100%   No intake or output data in the 24 hours ending 12/13/18 1528   Wt Readings from Last 3 Encounters:  09/02/18 57.6 kg     Exam  General: Somnolent  Eyes:   HEENT:  Atraumatic, normocephalic  Cardiovascular: S1 S2 auscultated, Regular rate and rhythm.  Respiratory: Clear to auscultation bilaterally, no wheezing, rales or rhonchi  Gastrointestinal: Soft, nontender, nondistended, + bowel sounds  Ext: no pedal edema bilaterally  Neuro: Difficult to assess  Musculoskeletal: No digital cyanosis, clubbing  Skin: No rashes  Psych: Somnolent   Data Reviewed:  I have personally reviewed following labs and imaging studies  Micro Results No results found for this or any previous visit (from the past 240 hour(s)).  Radiology Reports Ct Angio Head W Or Wo Contrast  Result Date: 12/13/2018 CLINICAL DATA:  Unresponsive patient EXAM: CT ANGIOGRAPHY HEAD AND NECK TECHNIQUE: Multidetector CT imaging of the head and neck was performed using the standard protocol during bolus administration of intravenous contrast. Multiplanar CT image reconstructions and MIPs were obtained to evaluate the vascular anatomy. Carotid stenosis measurements (when applicable) are obtained utilizing NASCET criteria, using the distal internal carotid diameter as the denominator. CONTRAST:  69mL ISOVUE-370 IOPAMIDOL (ISOVUE-370) INJECTION 76% COMPARISON:  Head CT 12/12/2018 FINDINGS: CTA NECK FINDINGS SKELETON: There is no bony spinal canal stenosis. No lytic or blastic lesion. OTHER NECK: Normal pharynx, larynx and major salivary glands. No cervical lymphadenopathy. Unremarkable thyroid gland. UPPER CHEST: No pneumothorax or pleural effusion. No nodules or masses. AORTIC ARCH: There is no calcific atherosclerosis of  the aortic arch. There is no aneurysm, dissection or hemodynamically significant stenosis of the visualized ascending aorta and aortic arch. Conventional 3 vessel aortic branching pattern. There is moderate stenosis of the proximal left subclavian artery. RIGHT CAROTID SYSTEM: --Common carotid artery: Widely patent origin without common carotid artery dissection or aneurysm. --Internal carotid artery: Normal without aneurysm, dissection or stenosis. --External carotid artery: No acute abnormality. LEFT CAROTID SYSTEM: --Common carotid artery: Widely patent origin without common carotid artery dissection or aneurysm. --Internal carotid artery: Normal without aneurysm, dissection or stenosis. --External carotid artery: No acute abnormality. VERTEBRAL ARTERIES: Left dominant configuration. Both origins are normal. No dissection, occlusion or flow-limiting stenosis to the vertebrobasilar confluence. CTA HEAD FINDINGS  ANTERIOR CIRCULATION: --Intracranial internal carotid arteries: Normal. --Anterior cerebral arteries: Normal. Both A1 segments are present. Patent anterior communicating artery. --Middle cerebral arteries: Normal. --Posterior communicating arteries: Absent bilaterally. POSTERIOR CIRCULATION: --Basilar artery: Normal. --Posterior cerebral arteries: Normal right PCA. The left PCA is occluded, age indeterminate. --Superior cerebellar arteries: Normal. --Inferior cerebellar arteries: Normal anterior and posterior inferior cerebellar arteries. VENOUS SINUSES: As permitted by contrast timing, patent. ANATOMIC VARIANTS: None DELAYED PHASE: No parenchymal contrast enhancement. Review of the MIP images confirms the above findings. IMPRESSION: 1. No emergent large vessel occlusion. 2. Occlusion of the left PCA, age indeterminate. 3. Moderate stenosis of the proximal left subclavian artery. Electronically Signed   By: Ulyses Jarred M.D.   On: 12/13/2018 04:34   Ct Head Wo Contrast  Result Date:  12/12/2018 CLINICAL DATA:  Altered mental status. Unresponsive. Recent stroke. EXAM: CT HEAD WITHOUT CONTRAST TECHNIQUE: Contiguous axial images were obtained from the base of the skull through the vertex without intravenous contrast. COMPARISON:  Head CT 12/12/2018 FINDINGS: Brain: Unchanged appearance of old left cerebellar hemisphere infarct. No edema within the pons. No acute hemorrhage. No mass effect. There is generalized atrophy without lobar predilection. There is hypoattenuation of the periventricular white matter, most commonly indicating chronic ischemic microangiopathy. Vascular: No abnormal hyperdensity of the major intracranial arteries or dural venous sinuses. No intracranial atherosclerosis. Skull: The visualized skull base, calvarium and extracranial soft tissues are normal. Sinuses/Orbits: Partial opacification of the sphenoid sinus. The orbits are normal. IMPRESSION: Unchanged examination without hemorrhage or mass effect. No pontine edema. Electronically Signed   By: Ulyses Jarred M.D.   On: 12/12/2018 23:30   Ct Head Wo Contrast  Result Date: 12/12/2018 CLINICAL DATA:  84 year old female with episode of vertigo this morning upon waking. Initial encounter. EXAM: CT HEAD WITHOUT CONTRAST TECHNIQUE: Contiguous axial images were obtained from the base of the skull through the vertex without intravenous contrast. COMPARISON:  None. FINDINGS: Brain: Small acute inferior left cerebellar (posterior inferior cerebellar artery distribution) infarct. Secondary to streak artifact from calvarium its difficult to evaluate for the possibility of tiny amount of blood. No obvious blood identified. Chronic microvascular changes. Remote small left basal ganglia infarct. No intracranial mass lesion noted on this unenhanced exam. Vascular: Atherosclerotic changes vertebral arteries and carotid arteries. No acute hyperdense vessel. Skull: No acute abnormality. Sinuses/Orbits: Post lens replacement. Moderate  mucosal thickening/opacification right sphenoid sinus. Minimal mucosal thickening left sphenoid sinus. Other: Mastoid air cells and middle ear cavities are clear. IMPRESSION: 1. Small acute inferior left cerebellar (posterior inferior cerebellar artery distribution) infarct. Secondary to streak artifact from calvarium its difficult to evaluate for the possibility of tiny amount of blood. No obvious blood identified. 2. Chronic microvascular changes. Remote small left basal ganglia infarct. 3. Moderate mucosal thickening/opacification right sphenoid sinus. Minimal mucosal thickening left sphenoid sinus. These results were called by telephone at the time of interpretation on 12/12/2018 at 5:35 pm to [facility] , who verbally acknowledged these results. Electronically Signed   By: Genia Del M.D.   On: 12/12/2018 17:40   Ct Angio Neck W Or Wo Contrast  Result Date: 12/13/2018 CLINICAL DATA:  Unresponsive patient EXAM: CT ANGIOGRAPHY HEAD AND NECK TECHNIQUE: Multidetector CT imaging of the head and neck was performed using the standard protocol during bolus administration of intravenous contrast. Multiplanar CT image reconstructions and MIPs were obtained to evaluate the vascular anatomy. Carotid stenosis measurements (when applicable) are obtained utilizing NASCET criteria, using the distal internal carotid diameter as the  denominator. CONTRAST:  66mL ISOVUE-370 IOPAMIDOL (ISOVUE-370) INJECTION 76% COMPARISON:  Head CT 12/12/2018 FINDINGS: CTA NECK FINDINGS SKELETON: There is no bony spinal canal stenosis. No lytic or blastic lesion. OTHER NECK: Normal pharynx, larynx and major salivary glands. No cervical lymphadenopathy. Unremarkable thyroid gland. UPPER CHEST: No pneumothorax or pleural effusion. No nodules or masses. AORTIC ARCH: There is no calcific atherosclerosis of the aortic arch. There is no aneurysm, dissection or hemodynamically significant stenosis of the visualized ascending aorta and aortic  arch. Conventional 3 vessel aortic branching pattern. There is moderate stenosis of the proximal left subclavian artery. RIGHT CAROTID SYSTEM: --Common carotid artery: Widely patent origin without common carotid artery dissection or aneurysm. --Internal carotid artery: Normal without aneurysm, dissection or stenosis. --External carotid artery: No acute abnormality. LEFT CAROTID SYSTEM: --Common carotid artery: Widely patent origin without common carotid artery dissection or aneurysm. --Internal carotid artery: Normal without aneurysm, dissection or stenosis. --External carotid artery: No acute abnormality. VERTEBRAL ARTERIES: Left dominant configuration. Both origins are normal. No dissection, occlusion or flow-limiting stenosis to the vertebrobasilar confluence. CTA HEAD FINDINGS ANTERIOR CIRCULATION: --Intracranial internal carotid arteries: Normal. --Anterior cerebral arteries: Normal. Both A1 segments are present. Patent anterior communicating artery. --Middle cerebral arteries: Normal. --Posterior communicating arteries: Absent bilaterally. POSTERIOR CIRCULATION: --Basilar artery: Normal. --Posterior cerebral arteries: Normal right PCA. The left PCA is occluded, age indeterminate. --Superior cerebellar arteries: Normal. --Inferior cerebellar arteries: Normal anterior and posterior inferior cerebellar arteries. VENOUS SINUSES: As permitted by contrast timing, patent. ANATOMIC VARIANTS: None DELAYED PHASE: No parenchymal contrast enhancement. Review of the MIP images confirms the above findings. IMPRESSION: 1. No emergent large vessel occlusion. 2. Occlusion of the left PCA, age indeterminate. 3. Moderate stenosis of the proximal left subclavian artery. Electronically Signed   By: Ulyses Jarred M.D.   On: 12/13/2018 04:34   Mr Brain Wo Contrast  Result Date: 12/13/2018 CLINICAL DATA:  84 y/o  F; stroke follow-up. EXAM: MRI HEAD WITHOUT CONTRAST TECHNIQUE: Axial DWI, coronal DWI, axial T2 FLAIR, axial SWI  sequences were acquired. COMPARISON:  12/13/2018 CTA of the head. 12/12/2018 MRI of the head. FINDINGS: Brain: Interval development of patchy foci of reduced diffusion compatible with interval acute infarction in the left thalamus, left posterior limb of internal capsule, left cerebral peduncle, left posterior hippocampus, and the left occipital lobe. Additional punctate foci of reduced diffusion are present within the left paramedian midbrain and right thalamus. Stable small focus of reduced diffusion within the left lateral pons. Chronic hemosiderin stained infarction within the left inferomedial cerebellar hemisphere, microvascular ischemic changes of the brain, and volume loss of the brain are stable on the T2 FLAIR weighted sequence in comparison with the prior MRI of the head. Stable small focus of chronic microhemorrhage within the right temporal periventricular white matter. No mass effect, hydrocephalus, or herniation. Vascular: Stable. Skull and upper cervical spine: Negative. Sinuses/Orbits: Sphenoid sinus mucosal thickening. No additional visible abnormal signal within paranasal sinuses or the mastoid air cells. Orbits are unremarkable. Other: None. IMPRESSION: 1. Interval development of acute infarctions within the left-greater-than-right thalami, left posterior limb of internal capsule, left cerebral peduncle, left midbrain, left posterior hippocampus, and left occipital lobe. No hemorrhage or mass effect. 2. Stable small acute/early subacute infarction within the left lateral pons. 3. Stable background of chronic microvascular ischemic changes, volume loss of the brain, and chronic infarction of the left inferior cerebellum. These results will be called to the ordering clinician or representative by the Radiologist Assistant, and communication documented in the PACS  or zVision Dashboard. Electronically Signed   By: Kristine Garbe M.D.   On: 12/13/2018 14:58   Mr Brain Wo Contrast  Result  Date: 12/12/2018 CLINICAL DATA:  84 y/o F; episodes of vertigo. Evaluation for stroke. EXAM: MRI HEAD WITHOUT CONTRAST TECHNIQUE: Multiplanar, multiecho pulse sequences of the brain and surrounding structures were obtained without intravenous contrast. COMPARISON:  12/12/2018 CT head. FINDINGS: Brain: 5 mm focus of reduced diffusion within the left lateral pons compatible with acute/early subacute infarction (series 5, image 61 and series 7, image 48). No associated hemorrhage or mass effect. Chronic hemosiderin stained infarction of the left inferomedial cerebellar hemisphere with laminar necrosis. Very small chronic infarction of the right inferior cerebellar hemisphere. Several nonspecific T2 FLAIR hyperintensities in subcortical and periventricular white matter are compatible with moderate chronic microvascular ischemic changes for age. Moderate volume loss of the brain. Additional punctate focus of susceptibility hypointensity is present within the right temporal periventricular white matter compatible with hemosiderin deposition of chronic hemorrhage. No acute extra-axial collection, hydrocephalus, mass effect, or herniation. Vascular: Normal flow voids. Skull and upper cervical spine: Normal marrow signal. Sinuses/Orbits: Moderate mucosal thickening of the right sphenoid sinus. No abnormal signal of the additional visible paranasal sinuses and the mastoid air cells. Bilateral intra-ocular lens replacement. Other: None. IMPRESSION: 1. 5 mm acute/early subacute infarction within the left lateral pons. No associated hemorrhage or mass effect. 2. Small chronic infarct in the left inferior medial cerebellar hemisphere and very small chronic infarction in the right inferior cerebellar hemisphere. 3. Moderate chronic microvascular ischemic changes and volume loss of the brain. 4. Sphenoid sinus disease. These results will be called to the ordering clinician or representative by the Radiologist Assistant, and  communication documented in the PACS or zVision Dashboard. Electronically Signed   By: Kristine Garbe M.D.   On: 12/12/2018 21:06    Lab Data:  CBC: Recent Labs  Lab 12/12/18 1346  WBC 6.1  HGB 12.0  HCT 38.0  MCV 92.0  PLT 093   Basic Metabolic Panel: Recent Labs  Lab 12/12/18 1346  NA 139  K 3.9  CL 103  CO2 23  GLUCOSE 211*  BUN 18  CREATININE 1.02*  CALCIUM 9.3   GFR: CrCl cannot be calculated (Unknown ideal weight.). Liver Function Tests: No results for input(s): AST, ALT, ALKPHOS, BILITOT, PROT, ALBUMIN in the last 168 hours. No results for input(s): LIPASE, AMYLASE in the last 168 hours. No results for input(s): AMMONIA in the last 168 hours. Coagulation Profile: No results for input(s): INR, PROTIME in the last 168 hours. Cardiac Enzymes: No results for input(s): CKTOTAL, CKMB, CKMBINDEX, TROPONINI in the last 168 hours. BNP (last 3 results) No results for input(s): PROBNP in the last 8760 hours. HbA1C: Recent Labs    12/13/18 0427  HGBA1C 5.8*   CBG: Recent Labs  Lab 12/12/18 1701  GLUCAP 100*   Lipid Profile: Recent Labs    12/13/18 0427  CHOL 174  HDL 43  LDLCALC 113*  TRIG 90  CHOLHDL 4.0   Thyroid Function Tests: No results for input(s): TSH, T4TOTAL, FREET4, T3FREE, THYROIDAB in the last 72 hours. Anemia Panel: No results for input(s): VITAMINB12, FOLATE, FERRITIN, TIBC, IRON, RETICCTPCT in the last 72 hours. Urine analysis:    Component Value Date/Time   COLORURINE STRAW (A) 12/12/2018 1344   APPEARANCEUR CLEAR 12/12/2018 1344   LABSPEC 1.003 (L) 12/12/2018 1344   PHURINE 7.0 12/12/2018 1344   GLUCOSEU 50 (A) 12/12/2018 1344   HGBUR NEGATIVE  12/12/2018 1344   [place] 12/12/2018 [place] 12/12/2018 1344   PROTEINUR NEGATIVE 12/12/2018 1344   NITRITE NEGATIVE 12/12/2018 1344   LEUKOCYTESUR NEGATIVE 12/12/2018 1344     Ripudeep Rai M.D. [facility] 12/13/2018, 3:28  PM  Pager: [phone] Between 7am to 7pm - call Pager - [phone]  After 7pm go to [email or web address] - password [email or web address]  Call night coverage person covering after 7pm

## 2018-12-13 NOTE — Progress Notes (Addendum)
Mr Brain Wo Contrast 12/13/2018 1. Interval development of acute infarctions within the left-greater-than-right thalami, left posterior limb of internal capsule, left cerebral peduncle, left midbrain, left posterior hippocampus, and left occipital lobe. No hemorrhage or mass effect. 2. Stable small acute/early subacute infarction within the left lateral pons. 3. Stable background of chronic microvascular ischemic changes, volume loss of the brain, and chronic infarction of the left inferior cerebellum.   New embolic infarcts since MRI yesterday, consistent with L PCA occlusion, infarcts due to known atrial fibrillation. No hmg. Continues on home Eliquis. new aspirin 81 mg daily added earlier.  Discussed via telephone with daughter. Alerted Dr. Erlinda Hong.  Burnetta Sabin, MSN, APRN, ANVP-BC, AGPCNP-BC Advanced Practice Stroke Nurse [place] for Schedule & Pager information 12/13/2018 3:04 PM

## 2018-12-13 NOTE — Progress Notes (Addendum)
OT and PT Cancellation Note  Patient Details Name: Kayla Contreras MRN: [id number] DOB: Jun 26, 1932   Cancelled Treatment:    Reason Eval/Treat Not Completed: Patient at procedure or test/ unavailable (MRI). Will follow up as schedule permits for OT/PT evals.  Lou Cal, OT Supplemental Rehabilitation Services Pager [phone] Office [phone]   Raymondo Band 12/13/2018, 2:11 PM  12/13/2018  Donnella Sham, PT Acute Rehabilitation Services [phone]  (pager) [phone]  (office)

## 2018-12-13 NOTE — Progress Notes (Signed)
Acute event note  Called in by RN for decreased LOC and left pupil larger and unreactive. On exam, she is not arousing to sternal rub, able to protect airway, on nox stim almost extensor postures in bilateral upper ext.  Taken for stat [facility] which is unchanged from prior.  Repeat exam with patient more awake.  Likely secondary to ativan for MRI that she was more sedated  Continue to monitor clinically  -- Amie Portland, MD [facility] Pager: [phone] If 7pm to 7am, please call on call as listed on AMION.

## 2018-12-13 NOTE — Discharge Instructions (Signed)

## 2018-12-13 NOTE — Evaluation (Signed)
Speech Language Pathology Evaluation Patient Details Name: Kayla Contreras MRN: [id number] DOB: Apr 25, 1932 Today's Date: 12/13/2018 Time: 7902-4097 SLP Time Calculation (min) (ACUTE ONLY): 33 min  Problem List:  Patient Active Problem List   Diagnosis Date Noted  . Stroke ([place]) 12/12/2018  . Atrial fibrillation [facility])    Past Medical History:  Past Medical History:  Diagnosis Date  . Atrial fibrillation [facility])    Past Surgical History:  Past Surgical History:  Procedure Laterality Date  . CARDIAC CATHETERIZATION     HPI:  Kayla Contreras is a 84 y.o. female past medical history of atrial fibrillation on Eliquis, BPPV in the past, anxiety, and history of vertigo. Presented to hospital with unsteady gait, no slurring of speech or swallowing problems. CT suggestive of cerebellar stroke. Patient passed the [facility].    Assessment / Plan / Recommendation Clinical Impression  Patient presents with a mild dysarthria and significant cognitive deficits both not present on admission. Patient's daughter reports that she was speaking fine last evening when she left the hopsital. This morning she is complaining to me and the nurse of a headache, dysarthria and cognitive deficits in the area of attention, memory, abstract thoughts as well as orientation. She knew her name, birthdate and place, however thought she was in [place], the year is 9 and her age is 45. Her condition seems to have progressed since last evening and the nurse has notified the physician. The 3 oz swallow test was repeated with no coughing or clearing, but a small amount of anterior spillage on the right side (right facial droop). Speech therapy will continue to follow patient for cognitive therapy to address the above areas of deficits.     SLP Assessment  SLP Recommendation/Assessment: Patient needs continued Speech Lanaguage Pathology Services SLP Visit Diagnosis: Cognitive communication deficit (R41.841)     Follow Up Recommendations  Inpatient Rehab    Frequency and Duration min 2x/week  2 weeks      SLP Evaluation Cognition  Overall Cognitive Status: Impaired/Different from baseline Arousal/Alertness: Lethargic Orientation Level: Oriented to person Attention: Focused Focused Attention: Impaired Focused Attention Impairment: Verbal basic Selective Attention: Impaired Selective Attention Impairment: Verbal basic Memory: Impaired Memory Impairment: Storage deficit;Retrieval deficit Awareness: Impaired Awareness Impairment: Intellectual impairment;Emergent impairment;Anticipatory impairment Problem Solving: Impaired Problem Solving Impairment: Verbal basic Executive Function: Sequencing;Organizing;Reasoning Reasoning: Impaired Reasoning Impairment: Verbal basic Sequencing: Impaired Organizing: Impaired Safety/Judgment: Impaired       Comprehension  Auditory Comprehension Overall Auditory Comprehension: Impaired Yes/No Questions: Within Functional Limits Commands: Impaired One Step Basic Commands: 50-74% accurate Two Step Basic Commands: 25-49% accurate Conversation: Simple Interfering Components: Attention;Processing speed;Working Field seismologist: Extra processing time;Increased volume;Repetition;Stressing words Visual Recognition/Discrimination Discrimination: Exceptions to [facility] Reading Comprehension Reading Status: Unable to assess (comment)    Expression Expression Primary Mode of Expression: Verbal Verbal Expression Overall Verbal Expression: Impaired Initiation: Impaired Level of Generative/Spontaneous Verbalization: Phrase Repetition: No impairment Naming: No impairment Pragmatics: Unable to assess Interfering Components: Attention;Speech intelligibility Effective Techniques: Open ended questions Written Expression Dominant Hand: Right Written Expression: Unable to assess (comment)   Oral / Motor  Oral Motor/Sensory Function Overall Oral  Motor/Sensory Function: Moderate impairment Facial ROM: Reduced right Facial Symmetry: Abnormal symmetry right Facial Strength: Reduced right Facial Sensation: Reduced right Lingual ROM: Reduced right Lingual Symmetry: Abnormal symmetry right Lingual Strength: Reduced Lingual Sensation: Reduced Mandible: Impaired Motor Speech Overall Motor Speech: Impaired Respiration: Within functional limits Phonation: Normal Resonance: Within functional limits Articulation: Impaired Level of Impairment: Word Intelligibility: Intelligibility reduced  Word: 50-74% accurate Phrase: 25-49% accurate Sentence: 25-49% accurate Conversation: 25-49% accurate Motor Planning: Not tested Motor Speech Errors: Unaware   GO                   [place], MA, CCC-SLP 12/13/2018 12:52 PM

## 2018-12-13 NOTE — Plan of Care (Signed)
progressing 

## 2018-12-13 NOTE — Progress Notes (Addendum)
STROKE TEAM PROGRESS NOTE   INTERVAL HISTORY Her daughter is at the bedside.  She is more sleepy today with increased R facial droop and UE weakness, no diplopia. Able to waken to eat and take pills, which she does without difficulty. Received ativan for imaging last evening. Most recent imaging at 320a without new significant finding. Scheduled xanax not given d/t drowsy state. Pt lives at home w/ dtr. Yesterday, understood situation, was oriented and anxious about procedures (normally anxious at baseline). Dtr. Plans to care for her at d/c. Hesitant to think she can tolerate 3-6 hrs therapy/day. Will follow for recommendations. Discussed with dtr, showed stroke imaging. Followed up with planned repeat MRI.  Vitals:   12/13/18 0200 12/13/18 0329 12/13/18 0723 12/13/18 1111  BP: (!) 139/58 (!) 131/57 127/70 (!) 141/59  Pulse:  63 (!) 58 60  Resp: 16 16 16 18   Temp:  99.2 F (37.3 C) 99.3 F (37.4 C) 98.2 F (36.8 C)  TempSrc:  Oral Axillary Axillary  SpO2: 99% 99% 100% 100%    CBC:  Recent Labs  Lab 12/12/18 1346  WBC 6.1  HGB 12.0  HCT 38.0  MCV 92.0  PLT 161    Basic Metabolic Panel:  Recent Labs  Lab 12/12/18 1346  NA 139  K 3.9  CL 103  CO2 23  GLUCOSE 211*  BUN 18  CREATININE 1.02*  CALCIUM 9.3   Lipid Panel:     Component Value Date/Time   CHOL 174 12/13/2018 0427   TRIG 90 12/13/2018 0427   HDL 43 12/13/2018 0427   CHOLHDL 4.0 12/13/2018 0427   VLDL 18 12/13/2018 0427   LDLCALC 113 (H) 12/13/2018 0427   HgbA1c:  Lab Results  Component Value Date   HGBA1C 5.8 (H) 12/13/2018   Urine Drug Screen: No results found for: LABOPIA, COCAINSCRNUR, LABBENZ, AMPHETMU, THCU, LABBARB  Alcohol Level No results found for: ETH  IMAGING Ct Angio Head W Or Wo Contrast  Result Date: 12/13/2018 CLINICAL DATA:  Unresponsive patient EXAM: CT ANGIOGRAPHY HEAD AND NECK TECHNIQUE: Multidetector CT imaging of the head and neck was performed using the standard protocol  during bolus administration of intravenous contrast. Multiplanar CT image reconstructions and MIPs were obtained to evaluate the vascular anatomy. Carotid stenosis measurements (when applicable) are obtained utilizing NASCET criteria, using the distal internal carotid diameter as the denominator. CONTRAST:  61mL ISOVUE-370 IOPAMIDOL (ISOVUE-370) INJECTION 76% COMPARISON:  Head CT 12/12/2018 FINDINGS: CTA NECK FINDINGS SKELETON: There is no bony spinal canal stenosis. No lytic or blastic lesion. OTHER NECK: Normal pharynx, larynx and major salivary glands. No cervical lymphadenopathy. Unremarkable thyroid gland. UPPER CHEST: No pneumothorax or pleural effusion. No nodules or masses. AORTIC ARCH: There is no calcific atherosclerosis of the aortic arch. There is no aneurysm, dissection or hemodynamically significant stenosis of the visualized ascending aorta and aortic arch. Conventional 3 vessel aortic branching pattern. There is moderate stenosis of the proximal left subclavian artery. RIGHT CAROTID SYSTEM: --Common carotid artery: Widely patent origin without common carotid artery dissection or aneurysm. --Internal carotid artery: Normal without aneurysm, dissection or stenosis. --External carotid artery: No acute abnormality. LEFT CAROTID SYSTEM: --Common carotid artery: Widely patent origin without common carotid artery dissection or aneurysm. --Internal carotid artery: Normal without aneurysm, dissection or stenosis. --External carotid artery: No acute abnormality. VERTEBRAL ARTERIES: Left dominant configuration. Both origins are normal. No dissection, occlusion or flow-limiting stenosis to the vertebrobasilar confluence. CTA HEAD FINDINGS ANTERIOR CIRCULATION: --Intracranial internal carotid arteries: Normal. --Anterior cerebral arteries:  Normal. Both A1 segments are present. Patent anterior communicating artery. --Middle cerebral arteries: Normal. --Posterior communicating arteries: Absent bilaterally.  POSTERIOR CIRCULATION: --Basilar artery: Normal. --Posterior cerebral arteries: Normal right PCA. The left PCA is occluded, age indeterminate. --Superior cerebellar arteries: Normal. --Inferior cerebellar arteries: Normal anterior and posterior inferior cerebellar arteries. VENOUS SINUSES: As permitted by contrast timing, patent. ANATOMIC VARIANTS: None DELAYED PHASE: No parenchymal contrast enhancement. Review of the MIP images confirms the above findings. IMPRESSION: 1. No emergent large vessel occlusion. 2. Occlusion of the left PCA, age indeterminate. 3. Moderate stenosis of the proximal left subclavian artery. Electronically Signed   By: Ulyses Jarred M.D.   On: 12/13/2018 04:34   Ct Head Wo Contrast  Result Date: 12/12/2018 CLINICAL DATA:  Altered mental status. Unresponsive. Recent stroke. EXAM: CT HEAD WITHOUT CONTRAST TECHNIQUE: Contiguous axial images were obtained from the base of the skull through the vertex without intravenous contrast. COMPARISON:  Head CT 12/12/2018 FINDINGS: Brain: Unchanged appearance of old left cerebellar hemisphere infarct. No edema within the pons. No acute hemorrhage. No mass effect. There is generalized atrophy without lobar predilection. There is hypoattenuation of the periventricular white matter, most commonly indicating chronic ischemic microangiopathy. Vascular: No abnormal hyperdensity of the major intracranial arteries or dural venous sinuses. No intracranial atherosclerosis. Skull: The visualized skull base, calvarium and extracranial soft tissues are normal. Sinuses/Orbits: Partial opacification of the sphenoid sinus. The orbits are normal. IMPRESSION: Unchanged examination without hemorrhage or mass effect. No pontine edema. Electronically Signed   By: Ulyses Jarred M.D.   On: 12/12/2018 23:30   Ct Head Wo Contrast  Result Date: 12/12/2018 CLINICAL DATA:  84 year old female with episode of vertigo this morning upon waking. Initial encounter. EXAM: CT HEAD  WITHOUT CONTRAST TECHNIQUE: Contiguous axial images were obtained from the base of the skull through the vertex without intravenous contrast. COMPARISON:  None. FINDINGS: Brain: Small acute inferior left cerebellar (posterior inferior cerebellar artery distribution) infarct. Secondary to streak artifact from calvarium its difficult to evaluate for the possibility of tiny amount of blood. No obvious blood identified. Chronic microvascular changes. Remote small left basal ganglia infarct. No intracranial mass lesion noted on this unenhanced exam. Vascular: Atherosclerotic changes vertebral arteries and carotid arteries. No acute hyperdense vessel. Skull: No acute abnormality. Sinuses/Orbits: Post lens replacement. Moderate mucosal thickening/opacification right sphenoid sinus. Minimal mucosal thickening left sphenoid sinus. Other: Mastoid air cells and middle ear cavities are clear. IMPRESSION: 1. Small acute inferior left cerebellar (posterior inferior cerebellar artery distribution) infarct. Secondary to streak artifact from calvarium its difficult to evaluate for the possibility of tiny amount of blood. No obvious blood identified. 2. Chronic microvascular changes. Remote small left basal ganglia infarct. 3. Moderate mucosal thickening/opacification right sphenoid sinus. Minimal mucosal thickening left sphenoid sinus. These results were called by telephone at the time of interpretation on 12/12/2018 at 5:35 pm to [facility] , who verbally acknowledged these results. Electronically Signed   By: Genia Del M.D.   On: 12/12/2018 17:40   Ct Angio Neck W Or Wo Contrast  Result Date: 12/13/2018 CLINICAL DATA:  Unresponsive patient EXAM: CT ANGIOGRAPHY HEAD AND NECK TECHNIQUE: Multidetector CT imaging of the head and neck was performed using the standard protocol during bolus administration of intravenous contrast. Multiplanar CT image reconstructions and MIPs were obtained to evaluate the vascular anatomy.  Carotid stenosis measurements (when applicable) are obtained utilizing NASCET criteria, using the distal internal carotid diameter as the denominator. CONTRAST:  49mL ISOVUE-370 IOPAMIDOL (ISOVUE-370) INJECTION 76%  COMPARISON:  Head CT 12/12/2018 FINDINGS: CTA NECK FINDINGS SKELETON: There is no bony spinal canal stenosis. No lytic or blastic lesion. OTHER NECK: Normal pharynx, larynx and major salivary glands. No cervical lymphadenopathy. Unremarkable thyroid gland. UPPER CHEST: No pneumothorax or pleural effusion. No nodules or masses. AORTIC ARCH: There is no calcific atherosclerosis of the aortic arch. There is no aneurysm, dissection or hemodynamically significant stenosis of the visualized ascending aorta and aortic arch. Conventional 3 vessel aortic branching pattern. There is moderate stenosis of the proximal left subclavian artery. RIGHT CAROTID SYSTEM: --Common carotid artery: Widely patent origin without common carotid artery dissection or aneurysm. --Internal carotid artery: Normal without aneurysm, dissection or stenosis. --External carotid artery: No acute abnormality. LEFT CAROTID SYSTEM: --Common carotid artery: Widely patent origin without common carotid artery dissection or aneurysm. --Internal carotid artery: Normal without aneurysm, dissection or stenosis. --External carotid artery: No acute abnormality. VERTEBRAL ARTERIES: Left dominant configuration. Both origins are normal. No dissection, occlusion or flow-limiting stenosis to the vertebrobasilar confluence. CTA HEAD FINDINGS ANTERIOR CIRCULATION: --Intracranial internal carotid arteries: Normal. --Anterior cerebral arteries: Normal. Both A1 segments are present. Patent anterior communicating artery. --Middle cerebral arteries: Normal. --Posterior communicating arteries: Absent bilaterally. POSTERIOR CIRCULATION: --Basilar artery: Normal. --Posterior cerebral arteries: Normal right PCA. The left PCA is occluded, age indeterminate. --Superior  cerebellar arteries: Normal. --Inferior cerebellar arteries: Normal anterior and posterior inferior cerebellar arteries. VENOUS SINUSES: As permitted by contrast timing, patent. ANATOMIC VARIANTS: None DELAYED PHASE: No parenchymal contrast enhancement. Review of the MIP images confirms the above findings. IMPRESSION: 1. No emergent large vessel occlusion. 2. Occlusion of the left PCA, age indeterminate. 3. Moderate stenosis of the proximal left subclavian artery. Electronically Signed   By: Ulyses Jarred M.D.   On: 12/13/2018 04:34   Mr Brain Wo Contrast  Result Date: 12/12/2018 CLINICAL DATA:  84 y/o F; episodes of vertigo. Evaluation for stroke. EXAM: MRI HEAD WITHOUT CONTRAST TECHNIQUE: Multiplanar, multiecho pulse sequences of the brain and surrounding structures were obtained without intravenous contrast. COMPARISON:  12/12/2018 CT head. FINDINGS: Brain: 5 mm focus of reduced diffusion within the left lateral pons compatible with acute/early subacute infarction (series 5, image 61 and series 7, image 48). No associated hemorrhage or mass effect. Chronic hemosiderin stained infarction of the left inferomedial cerebellar hemisphere with laminar necrosis. Very small chronic infarction of the right inferior cerebellar hemisphere. Several nonspecific T2 FLAIR hyperintensities in subcortical and periventricular white matter are compatible with moderate chronic microvascular ischemic changes for age. Moderate volume loss of the brain. Additional punctate focus of susceptibility hypointensity is present within the right temporal periventricular white matter compatible with hemosiderin deposition of chronic hemorrhage. No acute extra-axial collection, hydrocephalus, mass effect, or herniation. Vascular: Normal flow voids. Skull and upper cervical spine: Normal marrow signal. Sinuses/Orbits: Moderate mucosal thickening of the right sphenoid sinus. No abnormal signal of the additional visible paranasal sinuses and  the mastoid air cells. Bilateral intra-ocular lens replacement. Other: None. IMPRESSION: 1. 5 mm acute/early subacute infarction within the left lateral pons. No associated hemorrhage or mass effect. 2. Small chronic infarct in the left inferior medial cerebellar hemisphere and very small chronic infarction in the right inferior cerebellar hemisphere. 3. Moderate chronic microvascular ischemic changes and volume loss of the brain. 4. Sphenoid sinus disease. These results will be called to the ordering clinician or representative by the Radiologist Assistant, and communication documented in the PACS or zVision Dashboard. Electronically Signed   By: Kristine Garbe M.D.   On: 12/12/2018 21:06  PHYSICAL EXAM GENERAL: lethargic, in NAD HEENT: - Normocephalic and atraumatic LUNGS - Clear to auscultation bilaterally with no wheezes CV - S1S2 RRR, no m/r/g, equal pulses bilaterally. Ext: warm, well perfused, intact peripheral pulses, no edema  NEURO:  Mental Status: lethargic but will awaken with stimulation, knows self, place dtr.  Language: speech very dysarthric with bulk of conversation burden on examiner. Names, respeats and is fluent but difficult to understand. Unable to assess comprehension. Cranial Nerves: PERRL, dysconjugate w/ R eye deviated in and down, visual fields full, R lower facial weakness. facial sensation intact, hearing intact, tongue/uvula/soft palate midline, normal sternocleidomastoid and trapezius muscle strength. No evidence of tongue atrophy or fibrillations Motor: RUE drift with 4/5 proximally and 3/5 distally. LUE 5/5. right lower extremity exam slightly limited by pain due to chronic back pain, 4+/5. LLE 5/5. Orbits L over R Tone: is normal and bulk is normal Sensation- sensation intact to touch and temperature face, arm and leg.  Coordination: poor RUE FTN d/t weakness, LEU FTN intact.  Gait- deferred   ASSESSMENT/PLAN Ms. Kayla Contreras is a 84 y.o. female  with history of atrial fibrillation on Eliquis, BPPV in the past, anxiety, essential tremor presenting with vertigo and unsteady gait.   Stroke:   L lateral pontine infarct d/t SVD vs embolic secondary to known AF  Neuro worsening today - RUE drift, R facial, dysarthria, lethargy  CT head 12/17 1740 small inferior L cerebellar infarct. Small vessel disease. Old L BG infarct. R & L sphenoid sinus disease.  MRI 12/17 2021 L lateral pontine infarct. Small vessel disease. Atrophy. Old L & R cerebellar infarcts. Sphenoid sinus dz.   CT head 12/17 2307 unchanged. No pontine edema  CTA head & neck 12/18 0312 no LVO. Occlusion L PCA. Mod stenosis L subclavian.  Repeat limited MRI to check for extension of stroke, hmg on eliquis  2D Echo  pending   LDL 113  HgbA1c 5.8  SCDs for VTE prophylaxis  Eliquis (apixaban) daily 2.5 prior to admission, now on aspirin 325 mg daily. Pt with no HT. Low risk of HT with pontine stroke. Ok to resume home Eliquis and add low dose aspirin. Orders adjusted.   Therapy recommendations:  pending   Disposition:  pending   Atrial Fibrillation  Home anticoagulation:  Eliquis (apixaban) daily  . Ok to resume eliquis . Continue aspirin 81 mg daily and Eliquis (apixaban) daily at discharge   Hypertension  Stable . Permissive hypertension (OK if < 220/120) but gradually normalize in 5-7 days . Long-term BP goal normotensive  Hyperlipidemia  Home meds:  No statin  LDL 113, goal < 70  Add statin  - pravachol 40  Continue statin at discharge  Other Stroke Risk Factors  Advanced age  Hospital day # 0  Burnetta Sabin, MSN, APRN, ANVP-BC, AGPCNP-BC Advanced Practice Stroke Nurse [place] for Schedule & Pager information 12/13/2018 1:47 PM   ATTENDING NOTE: I reviewed above note and agree with the assessment and plan. Pt was seen and examined.   84 year old female with history of atrial fibrillation on Eliquis 2.5 twice  daily, compliant with medication, BPPV and essential tremor admitted for vertigo and gait imbalance.  Initial CT concerning for acute left cerebellar infarct and old left BG infarct, however MRI done last night showed left cerebellar stroke was old however there is a punctate left lateral pontine acute infarct.  CTA head and neck done early this morning showed left PCA occlusion.  EF 45 to 50%.  LDL 113 and A1c 5.8.  However, overnight patient symptoms worsened, more all visited with right facial droop, right arm and leg weakness as well as lethargy and sleepiness.  Repeat MRI showed left PCA infarct including right hippocampus, right MCA/PCA, right thalamus and internal capsule as well as small right thalamic infarct.  Stroke embolic pattern, patient already on Eliquis 2.5 twice daily adjusted for age and weight, recommend to continue Eliquis and add baby aspirin for stroke prevention.  Continue pravastatin.  Avoid low BP.  On exam, patient sleepy and lethargic, easily arousable however falls back to sleep without repetitive stimulation.  Visual field grossly intact, PERRLA, EOMI, and sustained bilateral gaze nystagmus.  Right facial droop, tongue midline.  Right upper extremity 3/5 and right lower extremity 4/5 proximal and 3-/5 distal.  Left upper and lower extremity 5/5.  PT/OT pending.  Will follow.  Rosalin Hawking, MD PhD Stroke Neurology 12/13/2018 5:13 PM  I spent  35 minutes in total face-to-face time with the patient, more than 50% of which was spent in counseling and coordination of care, reviewing test results, images and medication, and discussing the diagnosis of stroke extension, atrial fibrillation, antiplatelet use, treatment plan and potential prognosis. This patient's care requiresreview of multiple databases, neurological assessment, discussion with family, other specialists and medical decision making of high complexity.     To contact Stroke Continuity provider, please refer to  [email or web address] After hours, contact General Neurology

## 2018-12-13 NOTE — Progress Notes (Signed)
  Echocardiogram 2D Echocardiogram has been performed.  12/13/2018, 2:07 PM

## 2018-12-14 ENCOUNTER — Encounter (HOSPITAL_COMMUNITY): Payer: Self-pay | Admitting: *Deleted

## 2018-12-14 ENCOUNTER — Other Ambulatory Visit: Payer: Self-pay

## 2018-12-14 NOTE — Progress Notes (Signed)
Paged Md Rai via amnion to notify her about the patient's 2 second pauses and decreased HR 38 nonsustained. Patient remained asymptomatic. Awaiting reply. Oncoming Rn  Gabriel Earing is aware

## 2018-12-14 NOTE — Progress Notes (Signed)
Rehab Admissions Coordinator Note:  Patient was screened by Cleatrice Burke for appropriateness for an Inpatient Acute Rehab Consult per PT and OT recs. .  At this time, we are recommending Inpatient Rehab consult.  Danne Baxter, RN, MSN Rehab Admissions Coordinator [phone] 12/14/2018 3:55 PM

## 2018-12-14 NOTE — Progress Notes (Addendum)
STROKE TEAM PROGRESS NOTE   INTERVAL HISTORY Patient's daughter at the beside. States pt woke this am and stayed awake throughout breakfast, much better than yesterday. Her R arm is weaker than yesterday, but feels she is doing better. Patients son is on the way to [place] - should be here tomorrow.  Vitals:   12/13/18 2039 12/13/18 2343 12/14/18 0359 12/14/18 0737  BP: (!) 147/68 (!) 157/79 112/83 (!) 112/52  Pulse: 62 69 62 (!) 58  Resp: 18 16 16 20   Temp: 98 F (36.7 C) 98.4 F (36.9 C) 98.3 F (36.8 C) 97.6 F (36.4 C)  TempSrc: Oral Oral Oral Axillary  SpO2: 100% 100% 100% 99%    CBC:  Recent Labs  Lab 12/12/18 1346  WBC 6.1  HGB 12.0  HCT 38.0  MCV 92.0  PLT 474    Basic Metabolic Panel:  Recent Labs  Lab 12/12/18 1346  NA 139  K 3.9  CL 103  CO2 23  GLUCOSE 211*  BUN 18  CREATININE 1.02*  CALCIUM 9.3   Lipid Panel:     Component Value Date/Time   CHOL 174 12/13/2018 0427   TRIG 90 12/13/2018 0427   HDL 43 12/13/2018 0427   CHOLHDL 4.0 12/13/2018 0427   VLDL 18 12/13/2018 0427   LDLCALC 113 (H) 12/13/2018 0427   HgbA1c:  Lab Results  Component Value Date   HGBA1C 5.8 (H) 12/13/2018   IMAGING Ct Angio Head W Or Wo Contrast  Result Date: 12/13/2018 CLINICAL DATA:  Unresponsive patient EXAM: CT ANGIOGRAPHY HEAD AND NECK TECHNIQUE: Multidetector CT imaging of the head and neck was performed using the standard protocol during bolus administration of intravenous contrast. Multiplanar CT image reconstructions and MIPs were obtained to evaluate the vascular anatomy. Carotid stenosis measurements (when applicable) are obtained utilizing NASCET criteria, using the distal internal carotid diameter as the denominator. CONTRAST:  54mL ISOVUE-370 IOPAMIDOL (ISOVUE-370) INJECTION 76% COMPARISON:  Head CT 12/12/2018 FINDINGS: CTA NECK FINDINGS SKELETON: There is no bony spinal canal stenosis. No lytic or blastic lesion. OTHER NECK: Normal pharynx, larynx and major  salivary glands. No cervical lymphadenopathy. Unremarkable thyroid gland. UPPER CHEST: No pneumothorax or pleural effusion. No nodules or masses. AORTIC ARCH: There is no calcific atherosclerosis of the aortic arch. There is no aneurysm, dissection or hemodynamically significant stenosis of the visualized ascending aorta and aortic arch. Conventional 3 vessel aortic branching pattern. There is moderate stenosis of the proximal left subclavian artery. RIGHT CAROTID SYSTEM: --Common carotid artery: Widely patent origin without common carotid artery dissection or aneurysm. --Internal carotid artery: Normal without aneurysm, dissection or stenosis. --External carotid artery: No acute abnormality. LEFT CAROTID SYSTEM: --Common carotid artery: Widely patent origin without common carotid artery dissection or aneurysm. --Internal carotid artery: Normal without aneurysm, dissection or stenosis. --External carotid artery: No acute abnormality. VERTEBRAL ARTERIES: Left dominant configuration. Both origins are normal. No dissection, occlusion or flow-limiting stenosis to the vertebrobasilar confluence. CTA HEAD FINDINGS ANTERIOR CIRCULATION: --Intracranial internal carotid arteries: Normal. --Anterior cerebral arteries: Normal. Both A1 segments are present. Patent anterior communicating artery. --Middle cerebral arteries: Normal. --Posterior communicating arteries: Absent bilaterally. POSTERIOR CIRCULATION: --Basilar artery: Normal. --Posterior cerebral arteries: Normal right PCA. The left PCA is occluded, age indeterminate. --Superior cerebellar arteries: Normal. --Inferior cerebellar arteries: Normal anterior and posterior inferior cerebellar arteries. VENOUS SINUSES: As permitted by contrast timing, patent. ANATOMIC VARIANTS: None DELAYED PHASE: No parenchymal contrast enhancement. Review of the MIP images confirms the above findings. IMPRESSION: 1. No emergent large vessel  occlusion. 2. Occlusion of the left PCA, age  indeterminate. 3. Moderate stenosis of the proximal left subclavian artery. Electronically Signed   By: Ulyses Jarred M.D.   On: 12/13/2018 04:34   Ct Head Wo Contrast  Result Date: 12/12/2018 CLINICAL DATA:  Altered mental status. Unresponsive. Recent stroke. EXAM: CT HEAD WITHOUT CONTRAST TECHNIQUE: Contiguous axial images were obtained from the base of the skull through the vertex without intravenous contrast. COMPARISON:  Head CT 12/12/2018 FINDINGS: Brain: Unchanged appearance of old left cerebellar hemisphere infarct. No edema within the pons. No acute hemorrhage. No mass effect. There is generalized atrophy without lobar predilection. There is hypoattenuation of the periventricular white matter, most commonly indicating chronic ischemic microangiopathy. Vascular: No abnormal hyperdensity of the major intracranial arteries or dural venous sinuses. No intracranial atherosclerosis. Skull: The visualized skull base, calvarium and extracranial soft tissues are normal. Sinuses/Orbits: Partial opacification of the sphenoid sinus. The orbits are normal. IMPRESSION: Unchanged examination without hemorrhage or mass effect. No pontine edema. Electronically Signed   By: Ulyses Jarred M.D.   On: 12/12/2018 23:30   Ct Head Wo Contrast  Result Date: 12/12/2018 CLINICAL DATA:  84 year old female with episode of vertigo this morning upon waking. Initial encounter. EXAM: CT HEAD WITHOUT CONTRAST TECHNIQUE: Contiguous axial images were obtained from the base of the skull through the vertex without intravenous contrast. COMPARISON:  None. FINDINGS: Brain: Small acute inferior left cerebellar (posterior inferior cerebellar artery distribution) infarct. Secondary to streak artifact from calvarium its difficult to evaluate for the possibility of tiny amount of blood. No obvious blood identified. Chronic microvascular changes. Remote small left basal ganglia infarct. No intracranial mass lesion noted on this unenhanced  exam. Vascular: Atherosclerotic changes vertebral arteries and carotid arteries. No acute hyperdense vessel. Skull: No acute abnormality. Sinuses/Orbits: Post lens replacement. Moderate mucosal thickening/opacification right sphenoid sinus. Minimal mucosal thickening left sphenoid sinus. Other: Mastoid air cells and middle ear cavities are clear. IMPRESSION: 1. Small acute inferior left cerebellar (posterior inferior cerebellar artery distribution) infarct. Secondary to streak artifact from calvarium its difficult to evaluate for the possibility of tiny amount of blood. No obvious blood identified. 2. Chronic microvascular changes. Remote small left basal ganglia infarct. 3. Moderate mucosal thickening/opacification right sphenoid sinus. Minimal mucosal thickening left sphenoid sinus. These results were called by telephone at the time of interpretation on 12/12/2018 at 5:35 pm to [facility] , who verbally acknowledged these results. Electronically Signed   By: Genia Del M.D.   On: 12/12/2018 17:40   Ct Angio Neck W Or Wo Contrast  Result Date: 12/13/2018 CLINICAL DATA:  Unresponsive patient EXAM: CT ANGIOGRAPHY HEAD AND NECK TECHNIQUE: Multidetector CT imaging of the head and neck was performed using the standard protocol during bolus administration of intravenous contrast. Multiplanar CT image reconstructions and MIPs were obtained to evaluate the vascular anatomy. Carotid stenosis measurements (when applicable) are obtained utilizing NASCET criteria, using the distal internal carotid diameter as the denominator. CONTRAST:  31mL ISOVUE-370 IOPAMIDOL (ISOVUE-370) INJECTION 76% COMPARISON:  Head CT 12/12/2018 FINDINGS: CTA NECK FINDINGS SKELETON: There is no bony spinal canal stenosis. No lytic or blastic lesion. OTHER NECK: Normal pharynx, larynx and major salivary glands. No cervical lymphadenopathy. Unremarkable thyroid gland. UPPER CHEST: No pneumothorax or pleural effusion. No nodules or masses.  AORTIC ARCH: There is no calcific atherosclerosis of the aortic arch. There is no aneurysm, dissection or hemodynamically significant stenosis of the visualized ascending aorta and aortic arch. Conventional 3 vessel aortic branching pattern. There  is moderate stenosis of the proximal left subclavian artery. RIGHT CAROTID SYSTEM: --Common carotid artery: Widely patent origin without common carotid artery dissection or aneurysm. --Internal carotid artery: Normal without aneurysm, dissection or stenosis. --External carotid artery: No acute abnormality. LEFT CAROTID SYSTEM: --Common carotid artery: Widely patent origin without common carotid artery dissection or aneurysm. --Internal carotid artery: Normal without aneurysm, dissection or stenosis. --External carotid artery: No acute abnormality. VERTEBRAL ARTERIES: Left dominant configuration. Both origins are normal. No dissection, occlusion or flow-limiting stenosis to the vertebrobasilar confluence. CTA HEAD FINDINGS ANTERIOR CIRCULATION: --Intracranial internal carotid arteries: Normal. --Anterior cerebral arteries: Normal. Both A1 segments are present. Patent anterior communicating artery. --Middle cerebral arteries: Normal. --Posterior communicating arteries: Absent bilaterally. POSTERIOR CIRCULATION: --Basilar artery: Normal. --Posterior cerebral arteries: Normal right PCA. The left PCA is occluded, age indeterminate. --Superior cerebellar arteries: Normal. --Inferior cerebellar arteries: Normal anterior and posterior inferior cerebellar arteries. VENOUS SINUSES: As permitted by contrast timing, patent. ANATOMIC VARIANTS: None DELAYED PHASE: No parenchymal contrast enhancement. Review of the MIP images confirms the above findings. IMPRESSION: 1. No emergent large vessel occlusion. 2. Occlusion of the left PCA, age indeterminate. 3. Moderate stenosis of the proximal left subclavian artery. Electronically Signed   By: Ulyses Jarred M.D.   On: 12/13/2018 04:34   Mr  Brain Wo Contrast  Result Date: 12/13/2018 CLINICAL DATA:  84 y/o  F; stroke follow-up. EXAM: MRI HEAD WITHOUT CONTRAST TECHNIQUE: Axial DWI, coronal DWI, axial T2 FLAIR, axial SWI sequences were acquired. COMPARISON:  12/13/2018 CTA of the head. 12/12/2018 MRI of the head. FINDINGS: Brain: Interval development of patchy foci of reduced diffusion compatible with interval acute infarction in the left thalamus, left posterior limb of internal capsule, left cerebral peduncle, left posterior hippocampus, and the left occipital lobe. Additional punctate foci of reduced diffusion are present within the left paramedian midbrain and right thalamus. Stable small focus of reduced diffusion within the left lateral pons. Chronic hemosiderin stained infarction within the left inferomedial cerebellar hemisphere, microvascular ischemic changes of the brain, and volume loss of the brain are stable on the T2 FLAIR weighted sequence in comparison with the prior MRI of the head. Stable small focus of chronic microhemorrhage within the right temporal periventricular white matter. No mass effect, hydrocephalus, or herniation. Vascular: Stable. Skull and upper cervical spine: Negative. Sinuses/Orbits: Sphenoid sinus mucosal thickening. No additional visible abnormal signal within paranasal sinuses or the mastoid air cells. Orbits are unremarkable. Other: None. IMPRESSION: 1. Interval development of acute infarctions within the left-greater-than-right thalami, left posterior limb of internal capsule, left cerebral peduncle, left midbrain, left posterior hippocampus, and left occipital lobe. No hemorrhage or mass effect. 2. Stable small acute/early subacute infarction within the left lateral pons. 3. Stable background of chronic microvascular ischemic changes, volume loss of the brain, and chronic infarction of the left inferior cerebellum. These results will be called to the ordering clinician or representative by the Radiologist  Assistant, and communication documented in the PACS or zVision Dashboard. Electronically Signed   By: Kristine Garbe M.D.   On: 12/13/2018 14:58   Mr Brain Wo Contrast  Result Date: 12/12/2018 CLINICAL DATA:  84 y/o F; episodes of vertigo. Evaluation for stroke. EXAM: MRI HEAD WITHOUT CONTRAST TECHNIQUE: Multiplanar, multiecho pulse sequences of the brain and surrounding structures were obtained without intravenous contrast. COMPARISON:  12/12/2018 CT head. FINDINGS: Brain: 5 mm focus of reduced diffusion within the left lateral pons compatible with acute/early subacute infarction (series 5, image 61 and series 7, image 48).  No associated hemorrhage or mass effect. Chronic hemosiderin stained infarction of the left inferomedial cerebellar hemisphere with laminar necrosis. Very small chronic infarction of the right inferior cerebellar hemisphere. Several nonspecific T2 FLAIR hyperintensities in subcortical and periventricular white matter are compatible with moderate chronic microvascular ischemic changes for age. Moderate volume loss of the brain. Additional punctate focus of susceptibility hypointensity is present within the right temporal periventricular white matter compatible with hemosiderin deposition of chronic hemorrhage. No acute extra-axial collection, hydrocephalus, mass effect, or herniation. Vascular: Normal flow voids. Skull and upper cervical spine: Normal marrow signal. Sinuses/Orbits: Moderate mucosal thickening of the right sphenoid sinus. No abnormal signal of the additional visible paranasal sinuses and the mastoid air cells. Bilateral intra-ocular lens replacement. Other: None. IMPRESSION: 1. 5 mm acute/early subacute infarction within the left lateral pons. No associated hemorrhage or mass effect. 2. Small chronic infarct in the left inferior medial cerebellar hemisphere and very small chronic infarction in the right inferior cerebellar hemisphere. 3. Moderate chronic  microvascular ischemic changes and volume loss of the brain. 4. Sphenoid sinus disease. These results will be called to the ordering clinician or representative by the Radiologist Assistant, and communication documented in the PACS or zVision Dashboard. Electronically Signed   By: Kristine Garbe M.D.   On: 12/12/2018 21:06    2D Echocardiogram  - Left ventricle: Systolic function was mildly reduced. The estimated ejection fraction was in the range of 45% to 50%. Diffuse hypokinesis. - Aortic valve: Sclerosis without stenosis. There was mild regurgitation. Valve area (Vmax): 1.34 cm^2. - Mitral valve: There was mild regurgitation. - Left atrium: The atrium was mildly dilated. - Right atrium: The atrium was mildly dilated. - Atrial septum: No defect or patent foramen ovale was identified.   PHYSICAL EXAM GENERAL: elderly caucasian female in NAD HEENT: - Normocephalic and atraumatic LUNGS - Clear to auscultation bilaterally with no wheezes CV - S1S2 RRR, no m/r/g, equal pulses bilaterally. Ext: warm, well perfused, intact peripheral pulses, no edema  NEURO:  Mental Status: sleepy but awakes with stimulation, knows self, dtr.  Language: speech very dysarthric with bulk of conversation burden on examiner. Names, respeats and is fluent but difficult to understand. Possible expressive aphasia as unable to confirm all responses.  Cranial Nerves: PERRL, dysconjugate w/ R eye deviated in and down, visual fields full, R lower facial weakness. facial sensation intact, hearing intact, tongue/uvula/soft palate midline, normal sternocleidomastoid and trapezius muscle strength. No evidence of tongue atrophy or fibrillations Motor: RUE 1/5 proximally and 0/5 distally. LUE 5/5. right lower extremity 2/5. LLE 5/5.  Tone: is normal and bulk is normal Sensation- sensation intact to touch and temperature face, arm and leg.  Coordination: unable to complete RUE FTN d/t weakness, LEU FTN intact.  Gait-  deferred   ASSESSMENT/PLAN Ms. Kayla Contreras is a 84 y.o. female with history of atrial fibrillation on Eliquis, BPPV in the past, anxiety, essential tremor presenting with vertigo and unsteady gait.   Stroke:   L lateral pontine infarct d/t SVD followed by L PCA, R hippocampus, R MCA/PCA, T thalamic, R internal capsule infarcts which are embolic secondary to known AF  Neuro worsening today - RUE drift, R facial, dysarthria, lethargy  CT head 12/17 1740 small inferior L cerebellar infarct. Small vessel disease. Old L BG infarct. R & L sphenoid sinus disease.  MRI 12/17 2021 L lateral pontine infarct. Small vessel disease. Atrophy. Old L & R cerebellar infarcts. Sphenoid sinus dz.   CT head 12/17 2307 unchanged.  No pontine edema  CTA head & neck 12/18 0312 no LVO. Occlusion L PCA. Mod stenosis L subclavian.  Repeat limited MRI left PCA infarct including right hippocampus, right MCA/PCA, right thalamus and internal capsule as well as small right thalamic infarct.    2D Echo  EF 45-50%. No source of embolus   LDL 113  HgbA1c 5.8  Eliquis for VTE prophylaxis  Eliquis (apixaban) daily 2.5 prior to admission, now on aspirin 81 mg daily and Eliquis (apixaban) daily. Continue at d/c  Low risk of HT.   Therapy recommendations:  CIR. Note pending. Consult requested  Disposition:  pending (lives w/ dtr PTA)  Atrial Fibrillation  Home anticoagulation:  Eliquis (apixaban) daily  . Ok to resume eliquis . Continue aspirin 81 mg daily and Eliquis (apixaban) daily at discharge . Discussed with cardiology changing DOAC agents - defer to cardiology. We are fine either way.   Hypertension  Stable . Permissive hypertension (OK if < 220/120) but gradually normalize in 5-7 days . Long-term BP goal normotensive  Hyperlipidemia  Home meds:  No statin  LDL 113, goal < 70  Add statin  - pravachol 40  Continue statin at discharge  Other Stroke Risk Factors  Advanced age  Other  History  BPPV, vertigo  Essential tremor   Gait imbalance  NOTHING FURTHER TO ADD FROM THE STROKE STANDPOINT  Patient has a 10-15% risk of having another stroke over the next year, the highest risk is within 2 weeks of the most recent stroke/TIA (risk of having a stroke following a stroke or TIA is the same).  Ongoing risk factor control by Primary Care Physician  Stroke Service will sign off. Please call should any needs arise.  Follow-up Stroke Clinic at [facility] in 4 weeks, order placed.  Hospital day # 1  Burnetta Sabin, MSN, APRN, ANVP-BC, AGPCNP-BC Advanced Practice Stroke Nurse [place] for Schedule & Pager information 12/14/2018 9:48 AM   ATTENDING NOTE: I reviewed above note and agree with the assessment and plan. Pt was seen and examined.   84 year old female with history of atrial fibrillation on Eliquis 2.5 twice daily, compliant with medication, BPPV and essential tremor admitted for vertigo and gait imbalance.  Initial CT concerning for acute left cerebellar infarct and old left BG infarct, however MRI done last night showed left cerebellar stroke was old however there is a punctate left lateral pontine acute infarct.  CTA head and neck done early this morning showed left PCA occlusion.  EF 45 to 50%.  LDL 113 and A1c 5.8.  However, overnight patient symptoms worsened, more all visited with right facial droop, right arm and leg weakness as well as lethargy and sleepiness.  Repeat MRI showed left PCA infarct including right hippocampus, right MCA/PCA, right thalamus and internal capsule as well as small right thalamic infarct.  Stroke embolic pattern, patient already on Eliquis 2.5 twice daily adjusted for age and weight, recommend to continue Eliquis and add baby aspirin for stroke prevention.  Continue pravastatin.  Avoid low BP.  On today's exam, patient lethargic, but awake alert, orientated to self, but not to age, place  or time.    Seem to have right hemianopia, PERRLA, EOMI, no significant nystagmus on lateral gaze.  Right facial droop, tongue midline.  Right upper and lower extremity 2/5, worse than yesterday.  Left upper and lower extremity 5/5.  PT/OT recommend CIR for now.  Encourage patient to work hard with therapist for  better improvement. Patient developed bradycardia, cardiology consulted, currently hold Coreg and Cardizem.  Neurology will sign off. Please call with questions. Pt will follow up with stroke clinic NP at [facility] in about 4 weeks. Thanks for the consult.   Rosalin Hawking, MD PhD Stroke Neurology 12/14/2018 10:24 PM   To contact Stroke Continuity provider, please refer to [email or web address] After hours, contact General Neurology

## 2018-12-14 NOTE — Progress Notes (Addendum)
Physical Therapy Evaluation Patient Details Name: Kayla Contreras MRN: [id number] DOB: [birth year] Today's Date: 12/14/2018   History of Present Illness  pt is an 84 y/o female with PMH of Afib and vertigo, Presenting with unsteady gait without report of extremity weakness, numbness or tingling.  MRI on 12/17 showing infarcts in the left lateral PONS and L cerbellum. Repeat MRI on 12/18 showed interval treatment of acute infarction in the left greater than right thalami, left posterior limb of internal capsule, left cerebral peduncle, left midbrain, left posterior hippocampus and left occipital lobe, no hemorrhage or mass-effect.   Clinical Impression  Pt admitted with/for s/s of stroke that has progressed within this admission.  Presently needing significant assist .  Pt currently limited functionally due to the problems listed. ( See problems list.)   Pt will benefit from PT to maximize function and safety in order to get ready for next venue listed below.     Follow Up Recommendations CIR;Supervision/Assistance - 24 hour    Equipment Recommendations  Other (comment)(TBA)    Recommendations for Other Services Rehab consult     Precautions / Restrictions Precautions Precautions: Fall Precaution Comments: R hemi Restrictions Weight Bearing Restrictions: No      Mobility  Bed Mobility Overal bed mobility: Needs Assistance Bed Mobility: Rolling;Sidelying to Sit Rolling: Max assist Sidelying to sit: Max assist       General bed mobility comments: assist for LEs over EOB and truncal support upright into sitting  Transfers Overall transfer level: Needs assistance Equipment used: 2 person hand held assist Transfers: Sit to/from [facility] Sit to Stand: Max assist;+2 physical assistance;+2 safety/equipment Stand pivot transfers: Max assist;+2 physical assistance;+2 safety/equipment       General transfer comment: pt completing x2 sit<>stands from EOB. assist  to faciliate forward/upward stance with manual facilitation at waist and sternal region to maintain upright posture; pt requires R knee block due to instability. steadying assist and multimodal cues to advance LLE when turning/transitioning into sitting in recliner  Ambulation/Gait             General Gait Details: pt unable  Stairs            Wheelchair Mobility    Modified Rankin (Stroke Patients Only) Modified Rankin (Stroke Patients Only) Pre-Morbid Rankin Score: No symptoms Modified Rankin: Severe disability     Balance Overall balance assessment: Needs assistance Sitting-balance support: Feet supported;Single extremity supported Sitting balance-Leahy Scale: Poor Sitting balance - Comments: pt overall requires modA for sitting balance but is able to maintain for brief periods with minA (intermittently close minguard). pt with R lateral lean. Demonstrates truncal reactions when leaning towards L/R and when focused on maintaining static sitting  Postural control: Right lateral lean Standing balance support: Single extremity supported Standing balance-Leahy Scale: Zero Standing balance comment: maxA+2 for standing balance                             Pertinent Vitals/Pain Pain Assessment: Faces Faces Pain Scale: No hurt Pain Intervention(s): Monitored during session    Home Living Family/patient expects to be discharged to:: Private residence Living Arrangements: Children Available Help at Discharge: Family Type of Home: House Home Access: Level entry     Home Layout: Two level;Able to live on main level with bedroom/bathroom   Additional Comments: pt's daughter reports pt has chronic back pain     Prior Function Level of Independence: Independent with assistive device(s)  Comments: using quad cane for mobility; pt was performing ADLs without assist, ambulating home/community distances; pt was staying home by herself during the day  while daughter was at work without difficulties     Hand Dominance   Dominant Hand: Right    Extremity/Trunk Assessment        Lower Extremity Assessment Lower Extremity Assessment: RLE deficits/detail RLE Deficits / Details: spontaneous movements noted; pt does react to pinch and pulling leg away from stimuli RLE Coordination: decreased fine motor;decreased gross motor       Communication   Communication: Expressive difficulties  Cognition Arousal/Alertness: Lethargic Behavior During Therapy: Flat affect Overall Cognitive Status: Impaired/Different from baseline Area of Impairment: Attention;Following commands;Awareness                   Current Attention Level: Sustained   Following Commands: Follows one step commands inconsistently;Follows one step commands with increased time       General Comments: given multimodal cues and increased time pt following one step commands; pt lethargic this session but fairly easy to arouse with increased arousal levels noted with mobility; pt attempting to verbalize/communicate with therapists but with mumbled speech at this time. responds to yes/no questions appropriately      General Comments General comments (skin integrity, edema, etc.): pt's daughter present and supportive throughout session, educated on intermittently sitting to pt's R to increase attention to R side     Exercises     Assessment/Plan    PT Assessment Patient needs continued PT services  PT Problem List Decreased strength;Decreased activity tolerance;Decreased balance;Decreased mobility;Decreased coordination;Impaired sensation;Impaired tone       PT Treatment Interventions DME instruction;Gait training;Functional mobility training;Therapeutic activities;Therapeutic exercise;Balance training;Neuromuscular re-education;Patient/family education    PT Goals (Current goals can be found in the Care Plan section)  Acute Rehab PT Goals Patient Stated Goal:  none stated at this time PT Goal Formulation: Patient unable to participate in goal setting Time For Goal Achievement: 12/28/18 Potential to Achieve Goals: Fair    Frequency Min 3X/week   Barriers to discharge        Co-evaluation               AM-PAC PT "6 Clicks" Mobility  Outcome Measure Help needed turning from your back to your side while in a flat bed without using bedrails?: Total Help needed moving from lying on your back to sitting on the side of a flat bed without using bedrails?: Total Help needed moving to and from a bed to a chair (including a wheelchair)?: Total Help needed standing up from a chair using your arms (e.g., wheelchair or bedside chair)?: Total Help needed to walk in hospital room?: Total Help needed climbing 3-5 steps with a railing? : Total 6 Click Score: 6    End of Session   Activity Tolerance: Patient tolerated treatment well Patient left: in chair;with call bell/phone within reach;with chair alarm set Nurse Communication: Mobility status PT Visit Diagnosis: Other abnormalities of gait and mobility (R26.89);Hemiplegia and hemiparesis Hemiplegia - Right/Left: Right Hemiplegia - dominant/non-dominant: Dominant Hemiplegia - caused by: Cerebral infarction    Time: 0272-5366 PT Time Calculation (min) (ACUTE ONLY): 42 min   Charges:   PT Evaluation $PT Eval Moderate Complexity: 1 Mod PT Treatments $Neuromuscular Re-education: 8-22 mins        12/14/2018  Donnella Sham, PT Acute Rehabilitation Services [phone]  (pager) [phone]  (office)  Tessie Fass Brystal Kildow 12/14/2018, 3:39 PM

## 2018-12-14 NOTE — Consult Note (Signed)
Reason for Consult: Bradycardia Referring Physician: [facility]  Kayla Contreras is an 84 y.o. female.  HPI:   Patient unable to provide any history, obtained from daughter at bedside and from chart review.  83 year old Caucasian female with hypertension, type II diabetes mellitus, hyperlipidemia, coronary artery disease, medically treated for non-STEMI in 01/2016, persistent atrial fibrillation, moderate mitral regurgitation.  Patient was on eliquis 2.5 mg bid at baseline for anticoagulation for stroke prevention. Unfortunately, she was admitted on 12/12/18 with dizziness and unsteady gait symptoms. Subsequently, she had Rt facial droop and UE weakness. CTA head and neck on 12/18 morning showed left PCA occlusion. Repeat MRI later on 12/18 showed PCA infarct including right hippocampus, right MCA/PCA, right thalamus and internal capsule as well as small right thalamic infarct. Stroke was felt to be cardioembolic from afib, in spite of being on eliquis 2.5 mg bid (Age and weight appropriate dose). From future anticoagulation standpoint, she was recommended to liquis 2.5 mg bid and add aspirin 81 mg daily by stroke Neurologist Dr. Erlinda Hong.  Cardiology were consulted given patient's bradycardia and paused on the monitor. Her baseline medications, coreg 25 mg bid and diltiazem 120 mg daily, were stopped. Longest R-R interval on telemetry is about 2.4 sec. Resting bradycardia without hypotension.    Past Medical History:  Diagnosis Date  . Atrial fibrillation [facility])     Past Surgical History:  Procedure Laterality Date  . CARDIAC CATHETERIZATION      No family history on file.  Social History:  reports that she has never smoked. She has never used smokeless tobacco. No history on file for alcohol and drug.  Allergies:  Allergies  Allergen Reactions  . Other Other (See Comments)    Steroids Head hurts and gives her the shakes    Medications: I have reviewed the patient's current  medications.  Results for orders placed or performed during the hospital encounter of 12/12/18 (from the past 48 hour(s))  Urinalysis, Routine w reflex microscopic     Status: Abnormal   Collection Time: 12/12/18  1:44 PM  Result Value Ref Range   Color, Urine STRAW (A) YELLOW   APPearance CLEAR CLEAR   Specific Gravity, Urine 1.003 (L) 1.005 - 1.030   pH 7.0 5.0 - 8.0   Glucose, UA 50 (A) NEGATIVE mg/dL   Hgb urine dipstick NEGATIVE NEGATIVE   Bilirubin Urine NEGATIVE NEGATIVE   Ketones, ur NEGATIVE NEGATIVE mg/dL   Protein, ur NEGATIVE NEGATIVE mg/dL   Nitrite NEGATIVE NEGATIVE   Leukocytes, UA NEGATIVE NEGATIVE    Comment: Performed at [facility] [address] [place], [place] [address]  Basic metabolic panel     Status: Abnormal   Collection Time: 12/12/18  1:46 PM  Result Value Ref Range   Sodium 139 135 - 145 mmol/L   Potassium 3.9 3.5 - 5.1 mmol/L   Chloride 103 98 - 111 mmol/L   CO2 23 22 - 32 mmol/L   Glucose, Bld 211 (H) 70 - 99 mg/dL   BUN 18 8 - 23 mg/dL   Creatinine, Ser 1.02 (H) 0.44 - 1.00 mg/dL   Calcium 9.3 8.9 - 10.3 mg/dL   GFR calc non Af Amer 50 (L) >60 mL/min   GFR calc Af Amer 58 (L) >60 mL/min   Anion gap 13 5 - 15    Comment: Performed at [facility], [place] [address] [place], [place] [address]  CBC     Status: None   Collection Time:  12/12/18  1:46 PM  Result Value Ref Range   WBC 6.1 4.0 - 10.5 K/uL   RBC 4.13 3.87 - 5.11 MIL/uL   Hemoglobin 12.0 12.0 - 15.0 g/dL   HCT 38.0 36.0 - 46.0 %   MCV 92.0 80.0 - 100.0 fL   MCH 29.1 26.0 - 34.0 pg   MCHC 31.6 30.0 - 36.0 g/dL   RDW 12.3 11.5 - 15.5 %   Platelets 178 150 - 400 K/uL   nRBC 0.0 0.0 - 0.2 %    Comment: Performed at [facility], [place] [address], [place], [place] [address]  CBG monitoring, ED     Status: Abnormal   Collection Time: 12/12/18  5:01 PM  Result Value Ref Range   Glucose-Capillary 100 (H) 70 - 99 mg/dL  Hemoglobin A1c     Status: Abnormal   Collection  Time: 12/13/18  4:27 AM  Result Value Ref Range   Hgb A1c MFr Bld 5.8 (H) 4.8 - 5.6 %    Comment: (NOTE) Pre diabetes:          5.7%-6.4% Diabetes:              >6.4% Glycemic control for   <7.0% adults with diabetes    Mean Plasma Glucose 119.76 mg/dL    Comment: Performed at [facility] [address] [place], [place] [address]  Lipid panel     Status: Abnormal   Collection Time: 12/13/18  4:27 AM  Result Value Ref Range   Cholesterol 174 0 - 200 mg/dL   Triglycerides 90 <150 mg/dL   HDL 43 >40 mg/dL   Total CHOL/HDL Ratio 4.0 RATIO   VLDL 18 0 - 40 mg/dL   LDL Cholesterol 113 (H) 0 - 99 mg/dL    Comment:        Total Cholesterol/HDL:CHD Risk Coronary Heart Disease Risk Table                     Men   Women  1/2 Average Risk   3.4   3.3  Average Risk       5.0   4.4  2 X Average Risk   9.6   7.1  3 X Average Risk  23.4   11.0        Use the calculated Patient Ratio above and the CHD Risk Table to determine the patient's CHD Risk.        ATP III CLASSIFICATION (LDL):  <100     mg/dL   Optimal  100-129  mg/dL   Near or Above                    Optimal  130-159  mg/dL   Borderline  160-189  mg/dL   High  >190     mg/dL   Very High Performed at [facility] [address], [place], [place] [address]     Ct Angio Head W Or Wo Contrast  Result Date: 12/13/2018 CLINICAL DATA:  Unresponsive patient EXAM: CT ANGIOGRAPHY HEAD AND NECK TECHNIQUE: Multidetector CT imaging of the head and neck was performed using the standard protocol during bolus administration of intravenous contrast. Multiplanar CT image reconstructions and MIPs were obtained to evaluate the vascular anatomy. Carotid stenosis measurements (when applicable) are obtained utilizing NASCET criteria, using the distal internal carotid diameter as the denominator. CONTRAST:  30mL ISOVUE-370 IOPAMIDOL (ISOVUE-370) INJECTION 76% COMPARISON:  Head CT 12/12/2018 FINDINGS: CTA NECK  FINDINGS SKELETON: There  is no bony spinal canal stenosis. No lytic or blastic lesion. OTHER NECK: Normal pharynx, larynx and major salivary glands. No cervical lymphadenopathy. Unremarkable thyroid gland. UPPER CHEST: No pneumothorax or pleural effusion. No nodules or masses. AORTIC ARCH: There is no calcific atherosclerosis of the aortic arch. There is no aneurysm, dissection or hemodynamically significant stenosis of the visualized ascending aorta and aortic arch. Conventional 3 vessel aortic branching pattern. There is moderate stenosis of the proximal left subclavian artery. RIGHT CAROTID SYSTEM: --Common carotid artery: Widely patent origin without common carotid artery dissection or aneurysm. --Internal carotid artery: Normal without aneurysm, dissection or stenosis. --External carotid artery: No acute abnormality. LEFT CAROTID SYSTEM: --Common carotid artery: Widely patent origin without common carotid artery dissection or aneurysm. --Internal carotid artery: Normal without aneurysm, dissection or stenosis. --External carotid artery: No acute abnormality. VERTEBRAL ARTERIES: Left dominant configuration. Both origins are normal. No dissection, occlusion or flow-limiting stenosis to the vertebrobasilar confluence. CTA HEAD FINDINGS ANTERIOR CIRCULATION: --Intracranial internal carotid arteries: Normal. --Anterior cerebral arteries: Normal. Both A1 segments are present. Patent anterior communicating artery. --Middle cerebral arteries: Normal. --Posterior communicating arteries: Absent bilaterally. POSTERIOR CIRCULATION: --Basilar artery: Normal. --Posterior cerebral arteries: Normal right PCA. The left PCA is occluded, age indeterminate. --Superior cerebellar arteries: Normal. --Inferior cerebellar arteries: Normal anterior and posterior inferior cerebellar arteries. VENOUS SINUSES: As permitted by contrast timing, patent. ANATOMIC VARIANTS: None DELAYED PHASE: No parenchymal contrast enhancement. Review of the MIP images confirms the  above findings. IMPRESSION: 1. No emergent large vessel occlusion. 2. Occlusion of the left PCA, age indeterminate. 3. Moderate stenosis of the proximal left subclavian artery. Electronically Signed   By: Ulyses Jarred M.D.   On: 12/13/2018 04:34   Ct Head Wo Contrast  Result Date: 12/12/2018 CLINICAL DATA:  Altered mental status. Unresponsive. Recent stroke. EXAM: CT HEAD WITHOUT CONTRAST TECHNIQUE: Contiguous axial images were obtained from the base of the skull through the vertex without intravenous contrast. COMPARISON:  Head CT 12/12/2018 FINDINGS: Brain: Unchanged appearance of old left cerebellar hemisphere infarct. No edema within the pons. No acute hemorrhage. No mass effect. There is generalized atrophy without lobar predilection. There is hypoattenuation of the periventricular white matter, most commonly indicating chronic ischemic microangiopathy. Vascular: No abnormal hyperdensity of the major intracranial arteries or dural venous sinuses. No intracranial atherosclerosis. Skull: The visualized skull base, calvarium and extracranial soft tissues are normal. Sinuses/Orbits: Partial opacification of the sphenoid sinus. The orbits are normal. IMPRESSION: Unchanged examination without hemorrhage or mass effect. No pontine edema. Electronically Signed   By: Ulyses Jarred M.D.   On: 12/12/2018 23:30   Ct Head Wo Contrast  Result Date: 12/12/2018 CLINICAL DATA:  84 year old female with episode of vertigo this morning upon waking. Initial encounter. EXAM: CT HEAD WITHOUT CONTRAST TECHNIQUE: Contiguous axial images were obtained from the base of the skull through the vertex without intravenous contrast. COMPARISON:  None. FINDINGS: Brain: Small acute inferior left cerebellar (posterior inferior cerebellar artery distribution) infarct. Secondary to streak artifact from calvarium its difficult to evaluate for the possibility of tiny amount of blood. No obvious blood identified. Chronic microvascular  changes. Remote small left basal ganglia infarct. No intracranial mass lesion noted on this unenhanced exam. Vascular: Atherosclerotic changes vertebral arteries and carotid arteries. No acute hyperdense vessel. Skull: No acute abnormality. Sinuses/Orbits: Post lens replacement. Moderate mucosal thickening/opacification right sphenoid sinus. Minimal mucosal thickening left sphenoid sinus. Other: Mastoid air cells and middle ear cavities are clear. IMPRESSION: 1. Small acute inferior  left cerebellar (posterior inferior cerebellar artery distribution) infarct. Secondary to streak artifact from calvarium its difficult to evaluate for the possibility of tiny amount of blood. No obvious blood identified. 2. Chronic microvascular changes. Remote small left basal ganglia infarct. 3. Moderate mucosal thickening/opacification right sphenoid sinus. Minimal mucosal thickening left sphenoid sinus. These results were called by telephone at the time of interpretation on 12/12/2018 at 5:35 pm to [facility] , who verbally acknowledged these results. Electronically Signed   By: Genia Del M.D.   On: 12/12/2018 17:40   Ct Angio Neck W Or Wo Contrast  Result Date: 12/13/2018 CLINICAL DATA:  Unresponsive patient EXAM: CT ANGIOGRAPHY HEAD AND NECK TECHNIQUE: Multidetector CT imaging of the head and neck was performed using the standard protocol during bolus administration of intravenous contrast. Multiplanar CT image reconstructions and MIPs were obtained to evaluate the vascular anatomy. Carotid stenosis measurements (when applicable) are obtained utilizing NASCET criteria, using the distal internal carotid diameter as the denominator. CONTRAST:  44mL ISOVUE-370 IOPAMIDOL (ISOVUE-370) INJECTION 76% COMPARISON:  Head CT 12/12/2018 FINDINGS: CTA NECK FINDINGS SKELETON: There is no bony spinal canal stenosis. No lytic or blastic lesion. OTHER NECK: Normal pharynx, larynx and major salivary glands. No cervical lymphadenopathy.  Unremarkable thyroid gland. UPPER CHEST: No pneumothorax or pleural effusion. No nodules or masses. AORTIC ARCH: There is no calcific atherosclerosis of the aortic arch. There is no aneurysm, dissection or hemodynamically significant stenosis of the visualized ascending aorta and aortic arch. Conventional 3 vessel aortic branching pattern. There is moderate stenosis of the proximal left subclavian artery. RIGHT CAROTID SYSTEM: --Common carotid artery: Widely patent origin without common carotid artery dissection or aneurysm. --Internal carotid artery: Normal without aneurysm, dissection or stenosis. --External carotid artery: No acute abnormality. LEFT CAROTID SYSTEM: --Common carotid artery: Widely patent origin without common carotid artery dissection or aneurysm. --Internal carotid artery: Normal without aneurysm, dissection or stenosis. --External carotid artery: No acute abnormality. VERTEBRAL ARTERIES: Left dominant configuration. Both origins are normal. No dissection, occlusion or flow-limiting stenosis to the vertebrobasilar confluence. CTA HEAD FINDINGS ANTERIOR CIRCULATION: --Intracranial internal carotid arteries: Normal. --Anterior cerebral arteries: Normal. Both A1 segments are present. Patent anterior communicating artery. --Middle cerebral arteries: Normal. --Posterior communicating arteries: Absent bilaterally. POSTERIOR CIRCULATION: --Basilar artery: Normal. --Posterior cerebral arteries: Normal right PCA. The left PCA is occluded, age indeterminate. --Superior cerebellar arteries: Normal. --Inferior cerebellar arteries: Normal anterior and posterior inferior cerebellar arteries. VENOUS SINUSES: As permitted by contrast timing, patent. ANATOMIC VARIANTS: None DELAYED PHASE: No parenchymal contrast enhancement. Review of the MIP images confirms the above findings. IMPRESSION: 1. No emergent large vessel occlusion. 2. Occlusion of the left PCA, age indeterminate. 3. Moderate stenosis of the proximal  left subclavian artery. Electronically Signed   By: Ulyses Jarred M.D.   On: 12/13/2018 04:34   Mr Brain Wo Contrast  Result Date: 12/13/2018 CLINICAL DATA:  84 y/o  F; stroke follow-up. EXAM: MRI HEAD WITHOUT CONTRAST TECHNIQUE: Axial DWI, coronal DWI, axial T2 FLAIR, axial SWI sequences were acquired. COMPARISON:  12/13/2018 CTA of the head. 12/12/2018 MRI of the head. FINDINGS: Brain: Interval development of patchy foci of reduced diffusion compatible with interval acute infarction in the left thalamus, left posterior limb of internal capsule, left cerebral peduncle, left posterior hippocampus, and the left occipital lobe. Additional punctate foci of reduced diffusion are present within the left paramedian midbrain and right thalamus. Stable small focus of reduced diffusion within the left lateral pons. Chronic hemosiderin stained infarction within the left  inferomedial cerebellar hemisphere, microvascular ischemic changes of the brain, and volume loss of the brain are stable on the T2 FLAIR weighted sequence in comparison with the prior MRI of the head. Stable small focus of chronic microhemorrhage within the right temporal periventricular white matter. No mass effect, hydrocephalus, or herniation. Vascular: Stable. Skull and upper cervical spine: Negative. Sinuses/Orbits: Sphenoid sinus mucosal thickening. No additional visible abnormal signal within paranasal sinuses or the mastoid air cells. Orbits are unremarkable. Other: None. IMPRESSION: 1. Interval development of acute infarctions within the left-greater-than-right thalami, left posterior limb of internal capsule, left cerebral peduncle, left midbrain, left posterior hippocampus, and left occipital lobe. No hemorrhage or mass effect. 2. Stable small acute/early subacute infarction within the left lateral pons. 3. Stable background of chronic microvascular ischemic changes, volume loss of the brain, and chronic infarction of the left inferior  cerebellum. These results will be called to the ordering clinician or representative by the Radiologist Assistant, and communication documented in the PACS or zVision Dashboard. Electronically Signed   By: Kristine Garbe M.D.   On: 01-02-2019 14:58   Mr Brain Wo Contrast  Result Date: 12/12/2018 CLINICAL DATA:  84 y/o F; episodes of vertigo. Evaluation for stroke. EXAM: MRI HEAD WITHOUT CONTRAST TECHNIQUE: Multiplanar, multiecho pulse sequences of the brain and surrounding structures were obtained without intravenous contrast. COMPARISON:  12/12/2018 CT head. FINDINGS: Brain: 5 mm focus of reduced diffusion within the left lateral pons compatible with acute/early subacute infarction (series 5, image 61 and series 7, image 48). No associated hemorrhage or mass effect. Chronic hemosiderin stained infarction of the left inferomedial cerebellar hemisphere with laminar necrosis. Very small chronic infarction of the right inferior cerebellar hemisphere. Several nonspecific T2 FLAIR hyperintensities in subcortical and periventricular white matter are compatible with moderate chronic microvascular ischemic changes for age. Moderate volume loss of the brain. Additional punctate focus of susceptibility hypointensity is present within the right temporal periventricular white matter compatible with hemosiderin deposition of chronic hemorrhage. No acute extra-axial collection, hydrocephalus, mass effect, or herniation. Vascular: Normal flow voids. Skull and upper cervical spine: Normal marrow signal. Sinuses/Orbits: Moderate mucosal thickening of the right sphenoid sinus. No abnormal signal of the additional visible paranasal sinuses and the mastoid air cells. Bilateral intra-ocular lens replacement. Other: None. IMPRESSION: 1. 5 mm acute/early subacute infarction within the left lateral pons. No associated hemorrhage or mass effect. 2. Small chronic infarct in the left inferior medial cerebellar hemisphere and  very small chronic infarction in the right inferior cerebellar hemisphere. 3. Moderate chronic microvascular ischemic changes and volume loss of the brain. 4. Sphenoid sinus disease. These results will be called to the ordering clinician or representative by the Radiologist Assistant, and communication documented in the PACS or zVision Dashboard. Electronically Signed   By: Kristine Garbe M.D.   On: 12/12/2018 21:06   Cardiac studies: Hospital echocardiogram 2019-01-02: - Left ventricle: Systolic function was mildly reduced. The   estimated ejection fraction was in the range of 45% to 50%.   Diffuse hypokinesis. - Aortic valve: Sclerosis without stenosis. There was mild   regurgitation. Valve area (Vmax): 1.34 cm^2. - Mitral valve: There was mild regurgitation. - Left atrium: The atrium was mildly dilated. - Right atrium: The atrium was mildly dilated. - Atrial septum: No defect or patent foramen ovale was identified.  EKG 12/12/2018: Atrial fibrillation with IVCD  Telemetry: Slow ventricular response, longest R-R interval 2.3 sec  Review of Systems  Unable to perform ROS: Other  Arousable, but somnolent.  Unable to perform complete ROS  Blood pressure (!) 112/52, pulse (!) 58, temperature 97.6 F (36.4 C), temperature source Axillary, resp. rate 20, SpO2 99 %. Physical Exam  Nursing note and vitals reviewed. Constitutional: She appears well-developed and well-nourished. No distress.  HENT:  Head: Normocephalic and atraumatic.  Eyes: Pupils are equal, round, and reactive to light. Conjunctivae are normal.  Neck: No JVD present.  Cardiovascular: Intact distal pulses. An irregular rhythm present. Bradycardia present. Exam reveals no friction rub.  Murmur (II/VI holosystolic soft murmur) heard. Respiratory: Effort normal and breath sounds normal. She has no wheezes. She has no rales.  GI: Soft. Bowel sounds are normal.  Musculoskeletal:        General: Edema present.   Lymphadenopathy:    She has no cervical adenopathy.  Neurological:  Arousable. Prominent left facial droop. Detailed neuro exam not performed at this time  Skin: Skin is warm and dry.    Assessment/Recommendations:  84 year old Caucasian female with hypertension, type II diabetes mellitus, hyperlipidemia, coronary artery disease, medically treated for non-STEMI in 01/2016, persistent atrial fibrillation, moderate mitral regurgitation, now admitted with L>R thalamic CVA  CVA: Likely cardioembolic from Afib, in spite of being on appropriate anticoagulation. Stroke prevention for future remains crucial. Even though Xarelto 20 mg is an option, I am concerned about bleeding risk, especially in the setting of recent CVA. I agree with Dr. Phoebe Sharps recommendation of adding Aspirin 81 mg to eliquis 2.5 mg bid. There is no guideline recommendation to help in this situation. Other option could be percutaneous LAA closure.I do not think she is a candidate at this time given her acute neurological status.  Bradycardia: Agree with holding both coreg and diltiazem for now. If RVR develops, could start with metoprolol tartarate 25 mg bid.   Mildly reduced EF: Nonspecific. No further workup recommended at this time.    Kayla Contreras J Drayson Dorko 12/14/2018, 10:47 AM    [place], MD [facility] Pager: [phone] Office: [phone] If no answer Cell [phone]

## 2018-12-14 NOTE — Evaluation (Addendum)
Occupational Therapy Evaluation Patient Details Name: Kayla Contreras MRN: [id number] DOB: 03/04/1932 Today's Date: 12/14/2018    History of Present Illness pt is an 84 y/o female with PMH of Afib and vertigo, Presenting with unsteady gait without report of extremity weakness, numbness or tingling.  MRI on 12/17 showing infarcts in the left lateral PONS and L cerbellum. Repeat MRI on 12/18 showed interval treatment of acute infarction in the left greater than right thalami, left posterior limb of internal capsule, left cerebral peduncle, left midbrain, left posterior hippocampus and left occipital lobe, no hemorrhage or mass-effect.    Clinical Impression   This 84 y/o female presents with the above. At baseline pt is mod independent with ADLs and functional mobility using quad cane. Pt presenting with R side weakness (RUE>RLE), decreased sitting/standing balance, questionable visual deficits and R side inattention. Pt initially lethargic start of session but becomes increasingly awake/alert with mobility and progression OOB. Pt requiring min-modA to maintain static sitting balance EOB, requires two person assist for sit<>stand and for stand pivot transfers to recliner. She currently requires modA for UB ADL, max-totalA for LB ADLs. Given increased time and intermittent multimodal cues pt following one step commands throughout session, attempts to communicate but with speech difficulties at this time. Pt lives with daughter who was present and supportive throughout session. Pt will benefit from continued acute OT services and recommend follow up therapy services at CIR level after discharge to maximize her overall safety and independence with ADLs and mobility. Will follow.     Follow Up Recommendations  CIR;Supervision/Assistance - 24 hour    Equipment Recommendations  3 in 1 bedside commode;Other (comment)(TBD in next venue)           Precautions / Restrictions Precautions Precautions:  Fall Precaution Comments: R hemi Restrictions Weight Bearing Restrictions: No      Mobility Bed Mobility Overal bed mobility: Needs Assistance Bed Mobility: Rolling;Sidelying to Sit Rolling: Max assist Sidelying to sit: Max assist       General bed mobility comments: assist for LEs over EOB and truncal support upright into sitting  Transfers Overall transfer level: Needs assistance Equipment used: 2 person hand held assist Transfers: Sit to/from [facility] Sit to Stand: Max assist;+2 physical assistance;+2 safety/equipment Stand pivot transfers: Max assist;+2 physical assistance;+2 safety/equipment       General transfer comment: pt completing x2 sit<>stands from EOB. assist to faciliate forward/upward stance with manual facilitation at waist and sternal region to maintain upright posture; pt requires R knee block due to instability. steadying assist and multimodal cues to advance LLE when turning/transitioning into sitting in recliner    Balance Overall balance assessment: Needs assistance Sitting-balance support: Feet supported;Single extremity supported Sitting balance-Leahy Scale: Poor Sitting balance - Comments: pt overall requires modA for sitting balance but is able to maintain for brief periods with minA (intermittently close minguard). pt with R lateral lean. Demonstrates truncal reactions when leaning towards L/R and when focused on maintaining static sitting  Postural control: Right lateral lean Standing balance support: Single extremity supported Standing balance-Leahy Scale: Zero Standing balance comment: maxA+2 for standing balance                           ADL either performed or assessed with clinical judgement   ADL Overall ADL's : Needs assistance/impaired Eating/Feeding: Minimal assistance;Sitting   Grooming: Minimal assistance;Sitting Grooming Details (indicate cue type and reason): supported sitting Upper Body Bathing:  Moderate assistance;Sitting  Lower Body Bathing: Maximal assistance;+2 for physical assistance;+2 for safety/equipment;Sit to/from stand   Upper Body Dressing : Moderate assistance;Maximal assistance;Sitting   Lower Body Dressing: Maximal assistance;+2 for physical assistance;+2 for safety/equipment;Sit to/from stand Lower Body Dressing Details (indicate cue type and reason): maxA+2 standing balance  Toilet Transfer: Maximal assistance;+2 for physical assistance;+2 for safety/equipment;Stand-pivot Toilet Transfer Details (indicate cue type and reason): simulated in transfer to [place] and Hygiene: Total assistance;+2 for physical assistance;+2 for safety/equipment;Sit to/from stand       Functional mobility during ADLs: Maximal assistance;+2 for physical assistance;+2 for safety/equipment(stand pivot transfers)       Vision Patient Visual Report: Other (comment)(pt unable to state) Vision Assessment?: Yes;Vision impaired- to be further tested in functional context Eye Alignment: Impaired (comment)(R eye ptosis) Ocular Range of Motion: Restricted on the right;Impaired-to be further tested in functional context Alignment/Gaze Preference: Gaze left Tracking/Visual Pursuits: Impaired - to be further tested in functional context;Decreased smoothness of horizontal tracking;Decreased smoothness of vertical tracking;Decreased smoothness of eye movement to RIGHT inferior field;Decreased smoothness of eye movement to RIGHT superior field Additional Comments: pt appears to have R inattention and maintaining gaze L, with max visual/verbal cues pt able to track past midline towards R visual field, approx 3/4 of the way towards R field. Noted pt closing one eye while seated in recliner, suspect in attempts to focus due to visual deficits. Will continue to assess     Perception     Praxis      Pertinent Vitals/Pain Pain Assessment: Faces Faces Pain Scale: No  hurt Pain Intervention(s): Monitored during session     Hand Dominance Right   Extremity/Trunk Assessment Upper Extremity Assessment Upper Extremity Assessment: RUE deficits/detail RUE Deficits / Details: grossly 1/5 throughout; does not react to noxious/painful stimuli  RUE Sensation: decreased light touch RUE Coordination: decreased fine motor;decreased gross motor   Lower Extremity Assessment Lower Extremity Assessment: RLE deficits/detail RLE Deficits / Details: spontaneous movements noted; pt does react to pinch and pulling leg away from stimuli       Communication Communication Communication: Expressive difficulties   Cognition Arousal/Alertness: Lethargic Behavior During Therapy: Flat affect Overall Cognitive Status: Impaired/Different from baseline Area of Impairment: Attention;Following commands;Awareness                   Current Attention Level: Sustained   Following Commands: Follows one step commands inconsistently;Follows one step commands with increased time       General Comments: given multimodal cues and increased time pt following one step commands; pt lethargic this session but fairly easy to arouse with increased arousal levels noted with mobility; pt attempting to verbalize/communicate with therapists but with mumbled speech at this time. responds to yes/no questions appropriately   General Comments  pt's daughter present and supportive throughout session, educated on intermittently sitting to pt's R to increase attention to R side     Exercises     Shoulder Instructions      Home Living Family/patient expects to be discharged to:: Private residence Living Arrangements: Children Available Help at Discharge: Family Type of Home: House Home Access: Level entry     Home Layout: Two level;Able to live on main level with bedroom/bathroom                   Additional Comments: pt's daughter reports pt has chronic back pain   Lives  With: Daughter    Prior Functioning/Environment Level of Independence: Independent with assistive device(s)  Comments: using quad cane for mobility; pt was performing ADLs without assist, ambulating home/community distances; pt was staying home by herself during the day while daughter was at work without difficulties        OT Problem List: Decreased strength;Decreased range of motion;Decreased activity tolerance;Impaired balance (sitting and/or standing);Decreased cognition;Decreased coordination;Impaired vision/perception;Impaired sensation;Impaired UE functional use      OT Treatment/Interventions: Self-care/ADL training;DME and/or AE instruction;Therapeutic exercise;Neuromuscular education;Therapeutic activities;Cognitive remediation/compensation;Visual/perceptual remediation/compensation;Patient/family education;Balance training    OT Goals(Current goals can be found in the care plan section) Acute Rehab OT Goals Patient Stated Goal: none stated at this time OT Goal Formulation: Patient unable to participate in goal setting Time For Goal Achievement: 12/28/18 Potential to Achieve Goals: Good  OT Frequency: Min 3X/week   Barriers to D/C:            Co-evaluation              AM-PAC OT "6 Clicks" Daily Activity     Outcome Measure Help from another person eating meals?: A Little Help from another person taking care of personal grooming?: A Lot Help from another person toileting, which includes using toliet, bedpan, or urinal?: Total Help from another person bathing (including washing, rinsing, drying)?: A Lot Help from another person to put on and taking off regular upper body clothing?: A Lot Help from another person to put on and taking off regular lower body clothing?: Total 6 Click Score: 11   End of Session Nurse Communication: Mobility status  Activity Tolerance: Patient tolerated treatment well Patient left: in chair;with call bell/phone within  reach;with chair alarm set;with family/visitor present  OT Visit Diagnosis: Unsteadiness on feet (R26.81);Muscle weakness (generalized) (M62.81);Hemiplegia and hemiparesis Hemiplegia - Right/Left: Right Hemiplegia - dominant/non-dominant: Dominant Hemiplegia - caused by: Cerebral infarction                Time: 1761-6073 OT Time Calculation (min): 42 min Charges:  OT General Charges $OT Visit: 1 Visit OT Evaluation $OT Eval Moderate Complexity: 1 Mod  Lou Cal, OT [employer] Pager [phone] Office [phone]   Raymondo Band 12/14/2018, 1:45 PM

## 2018-12-14 NOTE — Progress Notes (Signed)
[facility]                                                                              Patient Demographics  Kayla Contreras, is a 84 y.o. female, DOB - 1932-03-16, ATF:[id number]  Admit date - 12/12/2018   Admitting Physician Phillips Grout, MD  Outpatient Primary MD for the patient is Tisovec, Fransico Him, MD  Outpatient specialists:   LOS - 1  days   Medical records reviewed and are as summarized below:    Chief Complaint  Patient presents with  . Dizziness  . arm and leg jerks       Brief summary    Kayla Contreras is a 84 y.o. female with medical history significant of A. fib on Eliquis comes in with unsteady gait since 8:00 this morning.  Patient has vertigo and takes meclizine at home.  But this felt much different than her routine vertigo she denies any fevers.  She did have a sinus cold recently.  She denies any weakness in any of her extremities.  No numbness or tingling.  No slurred speech or drooling.  Patient is being referred for admission for acute infarct shown on CAT scan.  She is compliant with her Eliquis she takes no other aspirin products.  Patient is being referred for admission for acute stroke.  She still reports that she feels like her limbs are heavy still  Assessment & Plan    Principal Problem: Acute CVA -In the setting of known A. fib, presented with acute vertigo, earlier this morning noted to be more somnolent, with right upper extremity drift, right facial droop and dysarthria. -MRI 12/17 showed left lateral pontine infarct, small vessel disease, old left and right cerebellar infarcts -CTA head and neck no large vessel occlusion, occluded left PCA -2D echo showed EF of 45 to 50% with diffuse hypokinesis no PFO -Neurology consulted, recommended resume home Eliquis and add low-dose aspirin -Repeat MRI on 12/18 showed interval treatment of acute infarction in the left greater than right thalami, left posterior limb of internal  capsule, left cerebral peduncle, left midbrain, left posterior hippocampus and left occipital lobe, no hemorrhage or mass-effect.  Some stable small acute/early subacute infarction in the left lateral pons -LDL 113, continue pravastatin -Hemoglobin A1c 5.8  Active Problems: Chronic atrial fibrillation (HCC) with bradycardia -This morning, patient having bradycardia with heart rate in 30s, sinus pauses x3, hold Coreg, Cardizem  -Cardiology consulted, continue aspirin, Eliquis.   Hyperlipidemia LDL 113, continue pravastatin  Code Status: Full code DVT Prophylaxis: Eliquis Family Communication: Discussed in detail with the patient, all imaging results, lab results explained to the patient's daughter at the bedside   Disposition Plan:   Time Spent in minutes  35   Procedures:  MRI brain, 2D echo  Consultants:   Neurology  Antimicrobials:      Medications  Scheduled Meds: .  stroke: mapping our early stages of recovery book   Does not apply Once  . ALPRAZolam  0.5 mg Oral BID  . apixaban  2.5 mg Oral BID  . aspirin EC  81 mg Oral Daily  . bumetanide  2 mg Oral Daily  . ferrous sulfate  325 mg Oral Q breakfast  . losartan  50 mg Oral BID  . magnesium oxide  400 mg Oral BID  . multivitamin  1 tablet Oral Q1200  . multivitamin with minerals  1 tablet Oral Daily  . potassium chloride  10 mEq Oral BID  . pravastatin  40 mg Oral q1800  . vitamin B-12   Oral Daily   Continuous Infusions: PRN Meds:.acetaminophen **OR** acetaminophen (TYLENOL) oral liquid 160 mg/5 mL **OR** acetaminophen   Antibiotics   Anti-infectives (From admission, onward)   None        Subjective:   Kayla Contreras was seen and examined today.  No new issues overnight.  Slightly more alert today, able to open her eyes and smile.  Other than that does not follow commands.  This morning having bradycardia with sinus pauses ~ 3 seconds, asymptomatic  Objective:   Vitals:   12/13/18 2343  12/14/18 0359 12/14/18 0737 12/14/18 1124  BP: (!) 157/79 112/83 (!) 112/52 (!) 126/48  Pulse: 69 62 (!) 58 (!) 54  Resp: 16 16 20 16   Temp: 98.4 F (36.9 C) 98.3 F (36.8 C) 97.6 F (36.4 C) 97.6 F (36.4 C)  TempSrc: Oral Oral Axillary Axillary  SpO2: 100% 100% 99% 100%    Intake/Output Summary (Last 24 hours) at 12/14/2018 1240 Last data filed at 12/14/2018 0700 Gross per 24 hour  Intake -  Output 700 ml  Net -700 ml     Wt Readings from Last 3 Encounters:  09/02/18 57.6 kg   Physical Exam  General: Slightly more alert from yesterday otherwise still not following verbal commands  Eyes:   HEENT:    Cardiovascular: S1 S2 auscultated, RRR, No pedal edema b/l  Respiratory: Clear to auscultation bilaterally, no wheezing, rales or rhonchi  Gastrointestinal: Soft, nontender, nondistended, + bowel sounds  Ext: no pedal edema bilaterally  Neuro: Unable to assess  Musculoskeletal: No digital cyanosis, clubbing  Skin: No rashes  Psych: Unable to assess  Data Reviewed:  I have personally reviewed following labs and imaging studies  Micro Results No results found for this or any previous visit (from the past 240 hour(s)).  Radiology Reports Ct Angio Head W Or Wo Contrast  Result Date: 12/13/2018 CLINICAL DATA:  Unresponsive patient EXAM: CT ANGIOGRAPHY HEAD AND NECK TECHNIQUE: Multidetector CT imaging of the head and neck was performed using the standard protocol during bolus administration of intravenous contrast. Multiplanar CT image reconstructions and MIPs were obtained to evaluate the vascular anatomy. Carotid stenosis measurements (when applicable) are obtained utilizing NASCET criteria, using the distal internal carotid diameter as the denominator. CONTRAST:  59mL ISOVUE-370 IOPAMIDOL (ISOVUE-370) INJECTION 76% COMPARISON:  Head CT 12/12/2018 FINDINGS: CTA NECK FINDINGS SKELETON: There is no bony spinal canal stenosis. No lytic or blastic lesion. OTHER NECK:  Normal pharynx, larynx and major salivary glands. No cervical lymphadenopathy. Unremarkable thyroid gland. UPPER CHEST: No pneumothorax or pleural effusion. No nodules or masses. AORTIC ARCH: There is no calcific atherosclerosis of the aortic arch. There is no aneurysm, dissection or hemodynamically significant stenosis of the visualized ascending aorta and aortic arch. Conventional 3 vessel aortic branching pattern. There is moderate stenosis of the proximal left subclavian artery. RIGHT CAROTID SYSTEM: --Common carotid artery: Widely patent origin without common carotid artery dissection or aneurysm. --Internal carotid artery: Normal without aneurysm, dissection or stenosis. --External carotid artery: No acute abnormality. LEFT CAROTID SYSTEM: --Common carotid artery: Widely patent origin without  common carotid artery dissection or aneurysm. --Internal carotid artery: Normal without aneurysm, dissection or stenosis. --External carotid artery: No acute abnormality. VERTEBRAL ARTERIES: Left dominant configuration. Both origins are normal. No dissection, occlusion or flow-limiting stenosis to the vertebrobasilar confluence. CTA HEAD FINDINGS ANTERIOR CIRCULATION: --Intracranial internal carotid arteries: Normal. --Anterior cerebral arteries: Normal. Both A1 segments are present. Patent anterior communicating artery. --Middle cerebral arteries: Normal. --Posterior communicating arteries: Absent bilaterally. POSTERIOR CIRCULATION: --Basilar artery: Normal. --Posterior cerebral arteries: Normal right PCA. The left PCA is occluded, age indeterminate. --Superior cerebellar arteries: Normal. --Inferior cerebellar arteries: Normal anterior and posterior inferior cerebellar arteries. VENOUS SINUSES: As permitted by contrast timing, patent. ANATOMIC VARIANTS: None DELAYED PHASE: No parenchymal contrast enhancement. Review of the MIP images confirms the above findings. IMPRESSION: 1. No emergent large vessel occlusion. 2.  Occlusion of the left PCA, age indeterminate. 3. Moderate stenosis of the proximal left subclavian artery. Electronically Signed   By: Ulyses Jarred M.D.   On: 12/13/2018 04:34   Ct Head Wo Contrast  Result Date: 12/12/2018 CLINICAL DATA:  Altered mental status. Unresponsive. Recent stroke. EXAM: CT HEAD WITHOUT CONTRAST TECHNIQUE: Contiguous axial images were obtained from the base of the skull through the vertex without intravenous contrast. COMPARISON:  Head CT 12/12/2018 FINDINGS: Brain: Unchanged appearance of old left cerebellar hemisphere infarct. No edema within the pons. No acute hemorrhage. No mass effect. There is generalized atrophy without lobar predilection. There is hypoattenuation of the periventricular white matter, most commonly indicating chronic ischemic microangiopathy. Vascular: No abnormal hyperdensity of the major intracranial arteries or dural venous sinuses. No intracranial atherosclerosis. Skull: The visualized skull base, calvarium and extracranial soft tissues are normal. Sinuses/Orbits: Partial opacification of the sphenoid sinus. The orbits are normal. IMPRESSION: Unchanged examination without hemorrhage or mass effect. No pontine edema. Electronically Signed   By: Ulyses Jarred M.D.   On: 12/12/2018 23:30   Ct Head Wo Contrast  Result Date: 12/12/2018 CLINICAL DATA:  84 year old female with episode of vertigo this morning upon waking. Initial encounter. EXAM: CT HEAD WITHOUT CONTRAST TECHNIQUE: Contiguous axial images were obtained from the base of the skull through the vertex without intravenous contrast. COMPARISON:  None. FINDINGS: Brain: Small acute inferior left cerebellar (posterior inferior cerebellar artery distribution) infarct. Secondary to streak artifact from calvarium its difficult to evaluate for the possibility of tiny amount of blood. No obvious blood identified. Chronic microvascular changes. Remote small left basal ganglia infarct. No intracranial mass  lesion noted on this unenhanced exam. Vascular: Atherosclerotic changes vertebral arteries and carotid arteries. No acute hyperdense vessel. Skull: No acute abnormality. Sinuses/Orbits: Post lens replacement. Moderate mucosal thickening/opacification right sphenoid sinus. Minimal mucosal thickening left sphenoid sinus. Other: Mastoid air cells and middle ear cavities are clear. IMPRESSION: 1. Small acute inferior left cerebellar (posterior inferior cerebellar artery distribution) infarct. Secondary to streak artifact from calvarium its difficult to evaluate for the possibility of tiny amount of blood. No obvious blood identified. 2. Chronic microvascular changes. Remote small left basal ganglia infarct. 3. Moderate mucosal thickening/opacification right sphenoid sinus. Minimal mucosal thickening left sphenoid sinus. These results were called by telephone at the time of interpretation on 12/12/2018 at 5:35 pm to [facility] , who verbally acknowledged these results. Electronically Signed   By: Genia Del M.D.   On: 12/12/2018 17:40   Ct Angio Neck W Or Wo Contrast  Result Date: 12/13/2018 CLINICAL DATA:  Unresponsive patient EXAM: CT ANGIOGRAPHY HEAD AND NECK TECHNIQUE: Multidetector CT imaging of the head and neck  was performed using the standard protocol during bolus administration of intravenous contrast. Multiplanar CT image reconstructions and MIPs were obtained to evaluate the vascular anatomy. Carotid stenosis measurements (when applicable) are obtained utilizing NASCET criteria, using the distal internal carotid diameter as the denominator. CONTRAST:  15mL ISOVUE-370 IOPAMIDOL (ISOVUE-370) INJECTION 76% COMPARISON:  Head CT 12/12/2018 FINDINGS: CTA NECK FINDINGS SKELETON: There is no bony spinal canal stenosis. No lytic or blastic lesion. OTHER NECK: Normal pharynx, larynx and major salivary glands. No cervical lymphadenopathy. Unremarkable thyroid gland. UPPER CHEST: No pneumothorax or pleural  effusion. No nodules or masses. AORTIC ARCH: There is no calcific atherosclerosis of the aortic arch. There is no aneurysm, dissection or hemodynamically significant stenosis of the visualized ascending aorta and aortic arch. Conventional 3 vessel aortic branching pattern. There is moderate stenosis of the proximal left subclavian artery. RIGHT CAROTID SYSTEM: --Common carotid artery: Widely patent origin without common carotid artery dissection or aneurysm. --Internal carotid artery: Normal without aneurysm, dissection or stenosis. --External carotid artery: No acute abnormality. LEFT CAROTID SYSTEM: --Common carotid artery: Widely patent origin without common carotid artery dissection or aneurysm. --Internal carotid artery: Normal without aneurysm, dissection or stenosis. --External carotid artery: No acute abnormality. VERTEBRAL ARTERIES: Left dominant configuration. Both origins are normal. No dissection, occlusion or flow-limiting stenosis to the vertebrobasilar confluence. CTA HEAD FINDINGS ANTERIOR CIRCULATION: --Intracranial internal carotid arteries: Normal. --Anterior cerebral arteries: Normal. Both A1 segments are present. Patent anterior communicating artery. --Middle cerebral arteries: Normal. --Posterior communicating arteries: Absent bilaterally. POSTERIOR CIRCULATION: --Basilar artery: Normal. --Posterior cerebral arteries: Normal right PCA. The left PCA is occluded, age indeterminate. --Superior cerebellar arteries: Normal. --Inferior cerebellar arteries: Normal anterior and posterior inferior cerebellar arteries. VENOUS SINUSES: As permitted by contrast timing, patent. ANATOMIC VARIANTS: None DELAYED PHASE: No parenchymal contrast enhancement. Review of the MIP images confirms the above findings. IMPRESSION: 1. No emergent large vessel occlusion. 2. Occlusion of the left PCA, age indeterminate. 3. Moderate stenosis of the proximal left subclavian artery. Electronically Signed   By: Ulyses Jarred  M.D.   On: 12/13/2018 04:34   Mr Brain Wo Contrast  Result Date: 12/13/2018 CLINICAL DATA:  84 y/o  F; stroke follow-up. EXAM: MRI HEAD WITHOUT CONTRAST TECHNIQUE: Axial DWI, coronal DWI, axial T2 FLAIR, axial SWI sequences were acquired. COMPARISON:  12/13/2018 CTA of the head. 12/12/2018 MRI of the head. FINDINGS: Brain: Interval development of patchy foci of reduced diffusion compatible with interval acute infarction in the left thalamus, left posterior limb of internal capsule, left cerebral peduncle, left posterior hippocampus, and the left occipital lobe. Additional punctate foci of reduced diffusion are present within the left paramedian midbrain and right thalamus. Stable small focus of reduced diffusion within the left lateral pons. Chronic hemosiderin stained infarction within the left inferomedial cerebellar hemisphere, microvascular ischemic changes of the brain, and volume loss of the brain are stable on the T2 FLAIR weighted sequence in comparison with the prior MRI of the head. Stable small focus of chronic microhemorrhage within the right temporal periventricular white matter. No mass effect, hydrocephalus, or herniation. Vascular: Stable. Skull and upper cervical spine: Negative. Sinuses/Orbits: Sphenoid sinus mucosal thickening. No additional visible abnormal signal within paranasal sinuses or the mastoid air cells. Orbits are unremarkable. Other: None. IMPRESSION: 1. Interval development of acute infarctions within the left-greater-than-right thalami, left posterior limb of internal capsule, left cerebral peduncle, left midbrain, left posterior hippocampus, and left occipital lobe. No hemorrhage or mass effect. 2. Stable small acute/early subacute infarction within the left lateral  pons. 3. Stable background of chronic microvascular ischemic changes, volume loss of the brain, and chronic infarction of the left inferior cerebellum. These results will be called to the ordering clinician or  representative by the Radiologist Assistant, and communication documented in the PACS or zVision Dashboard. Electronically Signed   By: Kristine Garbe M.D.   On: 12/13/2018 14:58   Mr Brain Wo Contrast  Result Date: 12/12/2018 CLINICAL DATA:  84 y/o F; episodes of vertigo. Evaluation for stroke. EXAM: MRI HEAD WITHOUT CONTRAST TECHNIQUE: Multiplanar, multiecho pulse sequences of the brain and surrounding structures were obtained without intravenous contrast. COMPARISON:  12/12/2018 CT head. FINDINGS: Brain: 5 mm focus of reduced diffusion within the left lateral pons compatible with acute/early subacute infarction (series 5, image 61 and series 7, image 48). No associated hemorrhage or mass effect. Chronic hemosiderin stained infarction of the left inferomedial cerebellar hemisphere with laminar necrosis. Very small chronic infarction of the right inferior cerebellar hemisphere. Several nonspecific T2 FLAIR hyperintensities in subcortical and periventricular white matter are compatible with moderate chronic microvascular ischemic changes for age. Moderate volume loss of the brain. Additional punctate focus of susceptibility hypointensity is present within the right temporal periventricular white matter compatible with hemosiderin deposition of chronic hemorrhage. No acute extra-axial collection, hydrocephalus, mass effect, or herniation. Vascular: Normal flow voids. Skull and upper cervical spine: Normal marrow signal. Sinuses/Orbits: Moderate mucosal thickening of the right sphenoid sinus. No abnormal signal of the additional visible paranasal sinuses and the mastoid air cells. Bilateral intra-ocular lens replacement. Other: None. IMPRESSION: 1. 5 mm acute/early subacute infarction within the left lateral pons. No associated hemorrhage or mass effect. 2. Small chronic infarct in the left inferior medial cerebellar hemisphere and very small chronic infarction in the right inferior cerebellar  hemisphere. 3. Moderate chronic microvascular ischemic changes and volume loss of the brain. 4. Sphenoid sinus disease. These results will be called to the ordering clinician or representative by the Radiologist Assistant, and communication documented in the PACS or zVision Dashboard. Electronically Signed   By: Kristine Garbe M.D.   On: 12/12/2018 21:06    Lab Data:  CBC: Recent Labs  Lab 12/12/18 1346  WBC 6.1  HGB 12.0  HCT 38.0  MCV 92.0  PLT 384   Basic Metabolic Panel: Recent Labs  Lab 12/12/18 1346  NA 139  K 3.9  CL 103  CO2 23  GLUCOSE 211*  BUN 18  CREATININE 1.02*  CALCIUM 9.3   GFR: CrCl cannot be calculated (Unknown ideal weight.). Liver Function Tests: No results for input(s): AST, ALT, ALKPHOS, BILITOT, PROT, ALBUMIN in the last 168 hours. No results for input(s): LIPASE, AMYLASE in the last 168 hours. No results for input(s): AMMONIA in the last 168 hours. Coagulation Profile: No results for input(s): INR, PROTIME in the last 168 hours. Cardiac Enzymes: No results for input(s): CKTOTAL, CKMB, CKMBINDEX, TROPONINI in the last 168 hours. BNP (last 3 results) No results for input(s): PROBNP in the last 8760 hours. HbA1C: Recent Labs    12/13/18 0427  HGBA1C 5.8*   CBG: Recent Labs  Lab 12/12/18 1701  GLUCAP 100*   Lipid Profile: Recent Labs    12/13/18 0427  CHOL 174  HDL 43  LDLCALC 113*  TRIG 90  CHOLHDL 4.0   Thyroid Function Tests: No results for input(s): TSH, T4TOTAL, FREET4, T3FREE, THYROIDAB in the last 72 hours. Anemia Panel: No results for input(s): VITAMINB12, FOLATE, FERRITIN, TIBC, IRON, RETICCTPCT in the last 72 hours. Urine analysis:  Component Value Date/Time   COLORURINE STRAW (A) 12/12/2018 1344   APPEARANCEUR CLEAR 12/12/2018 1344   LABSPEC 1.003 (L) 12/12/2018 1344   PHURINE 7.0 12/12/2018 1344   GLUCOSEU 50 (A) 12/12/2018 1344   HGBUR NEGATIVE 12/12/2018 1344   BILIRUBINUR NEGATIVE 12/12/2018 1344    KETONESUR NEGATIVE 12/12/2018 1344   PROTEINUR NEGATIVE 12/12/2018 1344   NITRITE NEGATIVE 12/12/2018 1344   LEUKOCYTESUR NEGATIVE 12/12/2018 1344     Andrue Dini M.D. [facility] 12/14/2018, 12:39 PM  Pager: [phone] Between 7am to 7pm - call Pager - [phone]  After 7pm go to [email or web address] - password [email or web address]  Call night coverage person covering after 7pm

## 2018-12-14 NOTE — Progress Notes (Signed)
Paged Dr. Tana Coast about pt. Having another 2.3 second pause. Patient is still asymptomatic. Nurse will continue to monitor.

## 2018-12-15 DIAGNOSIS — I4821 Permanent atrial fibrillation: Secondary | ICD-10-CM

## 2018-12-15 DIAGNOSIS — G894 Chronic pain syndrome: Secondary | ICD-10-CM

## 2018-12-15 DIAGNOSIS — N179 Acute kidney failure, unspecified: Secondary | ICD-10-CM

## 2018-12-15 DIAGNOSIS — I10 Essential (primary) hypertension: Secondary | ICD-10-CM

## 2018-12-15 DIAGNOSIS — I69398 Other sequelae of cerebral infarction: Secondary | ICD-10-CM

## 2018-12-15 DIAGNOSIS — R42 Dizziness and giddiness: Secondary | ICD-10-CM

## 2018-12-15 MED ORDER — RESOURCE THICKENUP CLEAR PO POWD
Freq: Once | ORAL | Status: AC
  Administered 2018-12-15: 17:00:00 via ORAL
  Filled 2018-12-15: qty 125

## 2018-12-15 NOTE — NC FL2 (Signed)
[place] LEVEL OF CARE SCREENING TOOL     IDENTIFICATION  Patient Name: Kayla Contreras Birthdate: Aug 30, 1932 Sex: female Admission Date (Current Location): 12/12/2018  [facility] and [place] Number:  Engineering geologist and Address:  [facility], [place] [address] [place], [place] [address]      Provider Number: [id number]  Attending Physician Name and Address:  Mendel Corning, MD  Relative Name and Phone Number:       Current Level of Care: Hospital Recommended Level of Care: [place] Prior Approval Number:    Date Approved/Denied:   PASRR Number: [id number]  Discharge Plan: SNF    Current Diagnoses: Patient Active Problem List   Diagnosis Date Noted  . Vertigo as late effect of stroke   . Chronic pain syndrome   . Essential hypertension   . AKI (acute kidney injury) ([place])   . Stroke ([place]) 12/12/2018  . Atrial fibrillation ([place])     Orientation RESPIRATION BLADDER Height & Weight     Self  Normal Incontinent Weight:   Height:     BEHAVIORAL SYMPTOMS/MOOD NEUROLOGICAL BOWEL NUTRITION STATUS      Continent Diet(heart healthy with thins)  AMBULATORY STATUS COMMUNICATION OF NEEDS Skin   Extensive Assist Verbally Normal                       Personal Care Assistance Level of Assistance  Bathing, Feeding, Dressing Bathing Assistance: Maximum assistance Feeding assistance: Maximum assistance Dressing Assistance: Maximum assistance     Functional Limitations Info  Sight, Hearing, Speech Sight Info: Adequate Hearing Info: Adequate Speech Info: Impaired(dysarthria/ slur)    SPECIAL CARE FACTORS FREQUENCY  PT (By licensed PT), OT (By licensed OT), Speech therapy     PT Frequency: 5x/wk OT Frequency: 5x/wk     Speech Therapy Frequency: 5x/wk      Contractures Contractures Info: Not present    Additional Factors Info  Code Status, Allergies, Psychotropic Code Status Info: full Allergies  Info: steroids Psychotropic Info: xanax 0.5 mg twice a day         Current Medications (12/15/2018):  This is the current hospital active medication list Current Facility-Administered Medications  Medication Dose Route Frequency Provider Last Rate Last Dose  .  stroke: mapping our early stages of recovery book   Does not apply Once Phillips Grout, MD      . acetaminophen (TYLENOL) tablet 650 mg  650 mg Oral Q4H PRN Phillips Grout, MD   650 mg at 12/15/18 1115   Or  . acetaminophen (TYLENOL) solution 650 mg  650 mg Per Tube Q4H PRN Phillips Grout, MD       Or  . acetaminophen (TYLENOL) suppository 650 mg  650 mg Rectal Q4H PRN Phillips Grout, MD      . ALPRAZolam Duanne Moron) tablet 0.5 mg  0.5 mg Oral BID Derrill Kay A, MD   0.5 mg at 12/15/18 1109  . apixaban (ELIQUIS) tablet 2.5 mg  2.5 mg Oral BID Burnetta Sabin L, NP   2.5 mg at 12/15/18 1108  . aspirin EC tablet 81 mg  81 mg Oral Daily Donzetta Starch, NP   81 mg at 12/15/18 1109  . bumetanide (BUMEX) tablet 2 mg  2 mg Oral Daily Derrill Kay A, MD   2 mg at 12/15/18 1109  . ferrous sulfate tablet 325 mg  325 mg Oral Q breakfast Phillips Grout, MD  325 mg at 12/15/18 1109  . losartan (COZAAR) tablet 50 mg  50 mg Oral BID Derrill Kay A, MD   50 mg at 12/15/18 1108  . magnesium oxide (MAG-OX) tablet 400 mg  400 mg Oral BID Derrill Kay A, MD   400 mg at 12/15/18 1110  . multivitamin (PROSIGHT) tablet 1 tablet  1 tablet Oral Q1200 Rai, Ripudeep K, MD   1 tablet at 12/15/18 1115  . multivitamin with minerals tablet 1 tablet  1 tablet Oral Daily Phillips Grout, MD   1 tablet at 12/15/18 1109  . potassium chloride (K-DUR,KLOR-CON) CR tablet 10 mEq  10 mEq Oral BID Phillips Grout, MD   10 mEq at 12/15/18 1109  . pravastatin (PRAVACHOL) tablet 40 mg  40 mg Oral q1800 Burnetta Sabin L, NP   40 mg at 12/14/18 1727  . vitamin B-12 (CYANOCOBALAMIN) tablet   Oral Daily Phillips Grout, MD   1,000 mcg at 12/15/18 1109     Discharge  Medications: Please see discharge summary for a list of discharge medications.  Relevant Imaging Results:  Relevant Lab Results:   Additional Information SS#: [id number]  Pollie Friar, RN

## 2018-12-15 NOTE — Care Management Important Message (Signed)
Important Message  Patient Details  Name: Kayla Contreras MRN: [id number] Date of Birth: 01-27-1932   Medicare Important Message Given:  Yes    Britanie Harshman [address]ine [address] 12/15/2018, 2:17 PM

## 2018-12-15 NOTE — Progress Notes (Signed)
Physical Therapy Treatment Patient Details Name: Kayla Contreras MRN: [id number] DOB: [birth year] Today's Date: 12/15/2018    History of Present Illness pt is an 84 y/o female with PMH of Afib and vertigo, Presenting with unsteady gait without report of extremity weakness, numbness or tingling.  MRI on 12/17 showing infarcts in the left lateral PONS and L cerbellum. Repeat MRI on 12/18 showed interval treatment of acute infarction in the left greater than right thalami, left posterior limb of internal capsule, left cerebral peduncle, left midbrain, left posterior hippocampus and left occipital lobe, no hemorrhage or mass-effect.     PT Comments    Pt is making slow gains.  Emphasis on transitioning to EOB, sitting balance/truncal activation, sit to stand and transfer to the chair.Marland KitchenMarland KitchenDtr educated on general ROM she and family could do this weekend to help progress this patient .    Follow Up Recommendations  CIR;Supervision/Assistance - 24 hour     Equipment Recommendations  Other (comment)(TBA)    Recommendations for Other Services Rehab consult     Precautions / Restrictions Precautions Precautions: Fall Precaution Comments: R hemi    Mobility  Bed Mobility Overal bed mobility: Needs Assistance Bed Mobility: Rolling;Sidelying to Sit Rolling: Max assist Sidelying to sit: Max assist       General bed mobility comments: initiated movement to cues with delay.  Assisted with right LE and trunk to roll R.  Pt attempted to assist with L UE to come up from the right side.  Transfers Overall transfer level: Needs assistance   Transfers: Sit to/from Stand;Stand Pivot Transfers Sit to Stand: Max assist;+2 physical assistance;+2 safety/equipment Stand pivot transfers: Max assist;+2 physical assistance;+2 safety/equipment       General transfer comment: cued to come forward and push with L UE.  assisted forward and up.  Right knee blocked during stand and  transfer.  Ambulation/Gait                 Stairs             Wheelchair Mobility    Modified Rankin (Stroke Patients Only) Modified Rankin (Stroke Patients Only) Pre-Morbid Rankin Score: No symptoms Modified Rankin: Severe disability     Balance     Sitting balance-Leahy Scale: Poor Sitting balance - Comments: pt displayed short bursts of trunk control, but generally lost balance forward and to the right.     Standing balance-Leahy Scale: Zero Standing balance comment: maxA+2 for standing balance.  Strong with L LE, but doesn't fully utilize the strength for standing.                            Cognition Arousal/Alertness: Lethargic Behavior During Therapy: Flat affect(cracks a smile infrequently) Overall Cognitive Status: Impaired/Different from baseline(NT formally)                                 General Comments: inattentive to the right side      Exercises      General Comments        Pertinent Vitals/Pain Pain Assessment: Faces Faces Pain Scale: No hurt    Home Living                      Prior Function            PT Goals (current goals can now be found in the care plan  section) Acute Rehab PT Goals Patient Stated Goal: none stated at this time PT Goal Formulation: Patient unable to participate in goal setting Time For Goal Achievement: 12/28/18 Potential to Achieve Goals: Fair Progress towards PT goals: Progressing toward goals    Frequency    Min 3X/week      PT Plan Current plan remains appropriate    Co-evaluation              AM-PAC PT "6 Clicks" Mobility   Outcome Measure  Help needed turning from your back to your side while in a flat bed without using bedrails?: Total Help needed moving from lying on your back to sitting on the side of a flat bed without using bedrails?: Total Help needed moving to and from a bed to a chair (including a wheelchair)?: Total Help needed  standing up from a chair using your arms (e.g., wheelchair or bedside chair)?: Total Help needed to walk in hospital room?: Total Help needed climbing 3-5 steps with a railing? : Total 6 Click Score: 6    End of Session   Activity Tolerance: Patient tolerated treatment well Patient left: in chair;with call bell/phone within reach;with chair alarm set Nurse Communication: Mobility status PT Visit Diagnosis: Other abnormalities of gait and mobility (R26.89);Hemiplegia and hemiparesis Hemiplegia - Right/Left: Right Hemiplegia - dominant/non-dominant: Dominant Hemiplegia - caused by: Cerebral infarction     Time: 8502-7741 PT Time Calculation (min) (ACUTE ONLY): 24 min  Charges:  $Therapeutic Activity: 23-37 mins                     12/15/2018  Donnella Sham, PT Acute Rehabilitation Services [phone]  (pager) [phone]  (office)   Kayla Contreras 12/15/2018, 4:25 PM

## 2018-12-15 NOTE — Care Management Note (Signed)
Case Management Note  Patient Details  Name: Kayla Contreras MRN: [id number] Date of Birth: Mar 16, 1932  Subjective/Objective:     Pt admitted with a stroke. She is from home with her daughter: Lynelle Smoke.  Recommendations are for CIR. CM met with the patient and Tammy and per Tammy the patient doesn't have 24 hour care after a CIR stay. Tammy is interested in SNF for rehab.                Action/Plan: CM provided the patients daughter: Tammy with choice of SNF rehabs in the area she requested: [place] and [place].  Tammy asked that her mother be faxed out in these areas.  CSW updated.   Expected Discharge Date:                  Expected Discharge Plan:  Skilled Nursing Facility  In-House Referral:  Clinical Social Work  Discharge planning Services  CM Consult  Post Acute Care Choice:    Choice offered to:     DME Arranged:    DME Agency:     HH Arranged:    [place] Agency:     Status of Service:  In process, will continue to follow  If discussed at Long Length of Stay Meetings, dates discussed:    Additional Comments:  Pollie Friar, RN 12/15/2018, 3:17 PM

## 2018-12-15 NOTE — Evaluation (Signed)
Clinical/Bedside Swallow Evaluation Patient Details  Name: Kayla Contreras MRN: [id number] Date of Birth: 22-Dec-1932  Today's Date: 12/15/2018 Time: SLP Start Time (ACUTE ONLY): 1520 SLP Stop Time (ACUTE ONLY): 1540 SLP Time Calculation (min) (ACUTE ONLY): 20 min  Past Medical History:  Past Medical History:  Diagnosis Date  . Atrial fibrillation [facility])    Past Surgical History:  Past Surgical History:  Procedure Laterality Date  . CARDIAC CATHETERIZATION     HPI:  Kayla Contreras is a 84 y.o. female past medical history of atrial fibrillation on Eliquis, BPPV in the past, anxiety, and history of vertigo. Presented to hospital with unsteady gait, no slurring of speech or swallowing problems. CT suggestive of cerebellar stroke. Patient passed the [facility]. Subsequently developed neuro changes; MRI was repeated 12/18, which revealed: Interval development of acute infarctions within the left-greater-than-right thalami, left posterior limb of internal capsule, left cerebral peduncle, left midbrain, left posterior hippocampus, and left occipital lobe. No hemorrhage or mass effect. 2. Stable small acute/early subacute infarction within the left lateral pons. 3. Stable background of chronic microvascular ischemic changes, volume loss of the brain, and chronic infarction of the left inferior cerebellum.     Assessment / Plan / Recommendation Clinical Impression  Pt presents with change in swallow function since admission.  Today she is quite lethargic, but was able to participate in limited clinical swallow evaluation. She demonstrates focal deficits CN V, VII; prolonged oral phase with mild residue in right lateral sulcus post-swallow of purees.  Consistent cough response noted after consumption of thin liquids; no cough with honey-thick liquids.  Her daughter/caregiver is at bedside, and describes no difficulty chewing soft solids at breakfast, prior to pt becoming so sleepy.  For tonight,  recommend downgrading diet to dysphagia 2, honey-thick liquids; pt will need MBS to ensure safest diet and identify impaired physiology.  D/W daughter, who agrees. SLP Visit Diagnosis: Dysphagia, unspecified (R13.10)    Aspiration Risk       Diet Recommendation   dysphagia 2, honey thick liquids  Medication Administration: Crushed with puree    Other  Recommendations Oral Care Recommendations: Oral care BID Other Recommendations: Order thickener from pharmacy   Follow up Recommendations        Frequency and Duration min 2x/week  1 week       Prognosis Prognosis for Safe Diet Advancement: Fair Barriers to Reach Goals: Cognitive deficits      Swallow Study   General Date of Onset: 12/12/18 HPI: Kayla Contreras is a 84 y.o. female past medical history of atrial fibrillation on Eliquis, BPPV in the past, anxiety, and history of vertigo. Presented to hospital with unsteady gait, no slurring of speech or swallowing problems. CT suggestive of cerebellar stroke. Patient passed the [facility]. Subsequently developed neuro changes; MRI was repeated 12/18, which revealed: Interval development of acute infarctions within the left-greater-than-right thalami, left posterior limb of internal capsule, left cerebral peduncle, left midbrain, left posterior hippocampus, and left occipital lobe. No hemorrhage or mass effect. 2. Stable small acute/early subacute infarction within the left lateral pons. 3. Stable background of chronic microvascular ischemic changes, volume loss of the brain, and chronic infarction of the left inferior cerebellum.   Type of Study: Bedside Swallow Evaluation Previous Swallow Assessment: no Diet Prior to this Study: Regular;Thin liquids Temperature Spikes Noted: No Respiratory Status: Room air History of Recent Intubation: No Behavior/Cognition: Lethargic/Drowsy Oral Care Completed by SLP: Recent completion by staff Oral Cavity - Dentition: Adequate natural  dentition Self-Feeding Abilities: Needs assist Patient Positioning: Upright in bed Baseline Vocal Quality: Low vocal intensity Volitional Cough: Cognitively unable to elicit Volitional Swallow: Unable to elicit    Oral/Motor/Sensory Function Overall Oral Motor/Sensory Function: Moderate impairment Facial ROM: Reduced right;Suspected CN VII (facial) dysfunction Facial Symmetry: Abnormal symmetry right;Suspected CN VII (facial) dysfunction Facial Sensation: Reduced right;Suspected CN V (Trigeminal) dysfunction Lingual Symmetry: Within Functional Limits   Ice Chips Ice chips: Not tested   Thin Liquid Thin Liquid: Impaired Presentation: Cup Pharyngeal  Phase Impairments: Cough - Immediate    Nectar Thick Nectar Thick Liquid: Not tested   Honey Thick Honey Thick Liquid: Within functional limits Presentation: Cup   Puree Puree: Impaired Oral Phase Impairments: Reduced lingual movement/coordination Oral Phase Functional Implications: Prolonged oral transit   Solid     Solid: Not tested      Kayla Contreras 12/15/2018,4:09 PM  Estill Bamberg L. Tivis Ringer, [place] Office number [phone] Pager [phone]

## 2018-12-15 NOTE — Consult Note (Signed)
Physical Medicine and Rehabilitation Consult Reason for Consult: decreased functional mobility with unsteady gait and dizziness Referring Physician: [facility]  HPI: Kayla Contreras is a 84 y.o.right handed female with history vertigo maintained on meclizine, chronic back pain and atrial fibrillation maintained on Eliquis. Per chart review and family, patient lives with daughter. Two-level home with bedroom on main and level entry. Independent with assistive device using a quad cane. Patient was performing ADLs without assistance. Daughter works during the day. Presented 12/12/2018 dizziness and unsteady gait with mild right sided weakness. CT scan reviewed, showing left cerebellar infarct.  Per report, small acute inferior left cerebellar posterior inferior cerebellar artery distribution infarction. MRI 5 mm acute early subacute infarction within the left lateral pons.CT angiogram of head and neck no emergent large vessel occlusion. Echocardiogram with ejection fraction of 50% diffuse hypokinesis. Rapid response 12/13/2018 for increased slurred speech right side weakness. Follow-up MRI 12/13/2018 for question increased right-sided weakness again shows interval development of acute infarctions within the left greater than right thalami, left posterior limb internal capsule, left cerebellar peduncle, left midbrain, left posterior hippocampus and left occipital lobe. Neurology follow-up patient remains on Eliquis as prior to admission with the addition of aspirin. Cardiology consulted 12/14/2018 for bradycardia and both Coreg and diltiazem were held.Tolerating a regular diet. Therapy evaluations completed with recommendations of physical medicine rehabilitation consult.  Review of Systems  Unable to perform ROS: Mental acuity   Past Medical History:  Diagnosis Date  . Atrial fibrillation [facility])    Past Surgical History:  Procedure Laterality Date  . CARDIAC CATHETERIZATION     No family history on  file., unable to obtain from patient. Social History:  reports that she has never smoked. She has never used smokeless tobacco. No history on file for alcohol and drug. Allergies:  Allergies  Allergen Reactions  . Other Other (See Comments)    Steroids Head hurts and gives her the shakes   Medications Prior to Admission  Medication Sig Dispense Refill  . ALPRAZolam (XANAX) 0.5 MG tablet Take 0.5 mg by mouth 2 (two) times daily.     Marland Kitchen apixaban (ELIQUIS) 2.5 MG TABS tablet Take 2.5 mg by mouth 2 (two) times daily.     . bumetanide (BUMEX) 2 MG tablet Take 2 mg by mouth daily.     . carvedilol (COREG) 25 MG tablet Take 25 mg by mouth 2 (two) times daily.    . Cyanocobalamin (VITAMIN B-12) 5000 MCG SUBL Place 5,000 Units under the tongue daily.     Marland Kitchen diltiazem (DILT-XR) 120 MG 24 hr capsule Take 120 mg by mouth daily.     Marland Kitchen esomeprazole (NEXIUM) 40 MG capsule Take 40 mg by mouth daily at 12 noon.    . ferrous sulfate 325 (65 FE) MG tablet Take 325 mg by mouth daily with breakfast.    . losartan (COZAAR) 50 MG tablet Take 50 mg by mouth 2 (two) times daily.    . magnesium oxide (MAG-OX) 400 MG tablet Take 400 mg by mouth 2 (two) times daily.    . metFORMIN (GLUCOPHAGE) 500 MG tablet Take 1,000 mg by mouth 2 (two) times daily.     . Multiple Vitamins-Minerals (CENTRUM SILVER 50+WOMEN) TABS Take 1 tablet by mouth daily.    . Multiple Vitamins-Minerals (PRESERVISION AREDS 2+MULTI VIT) CAPS Take 1 tablet by mouth daily at 12 noon.    . potassium chloride (KLOR-CON) 8 MEQ tablet Take 8 mEq by mouth 2 (two) times  daily.    . ondansetron (ZOFRAN ODT) 4 MG disintegrating tablet Take 1 tablet (4 mg total) by mouth every 8 (eight) hours as needed for nausea or vomiting. (Patient not taking: Reported on 12/12/2018) 20 tablet 0    Home: Home Living Family/patient expects to be discharged to:: Private residence Living Arrangements: Children Available Help at Discharge: Family Type of Home: House Home  Access: Level entry Home Layout: Two level, Able to live on main level with bedroom/bathroom Additional Comments: pt's daughter reports pt has chronic back pain   Lives With: Daughter  Functional History: Prior Function Level of Independence: Independent with assistive device(s) Comments: using quad cane for mobility; pt was performing ADLs without assist, ambulating home/community distances; pt was staying home by herself during the day while daughter was at work without difficulties Functional Status:  Mobility: Bed Mobility Overal bed mobility: Needs Assistance Bed Mobility: Rolling, Sidelying to Sit Rolling: Max assist Sidelying to sit: Max assist General bed mobility comments: assist for LEs over EOB and truncal support upright into sitting Transfers Overall transfer level: Needs assistance Equipment used: 2 person hand held assist Transfers: Sit to/from Stand, Stand Pivot Transfers Sit to Stand: Max assist, +2 physical assistance, +2 safety/equipment Stand pivot transfers: Max assist, +2 physical assistance, +2 safety/equipment General transfer comment: pt completing x2 sit<>stands from EOB. assist to faciliate forward/upward stance with manual facilitation at waist and sternal region to maintain upright posture; pt requires R knee block due to instability. steadying assist and multimodal cues to advance LLE when turning/transitioning into sitting in recliner Ambulation/Gait General Gait Details: pt unable    ADL: ADL Overall ADL's : Needs assistance/impaired Eating/Feeding: Minimal assistance, Sitting Grooming: Minimal assistance, Sitting Grooming Details (indicate cue type and reason): supported sitting Upper Body Bathing: Moderate assistance, Sitting Lower Body Bathing: Maximal assistance, +2 for physical assistance, +2 for safety/equipment, Sit to/from stand Upper Body Dressing : Moderate assistance, Maximal assistance, Sitting Lower Body Dressing: Maximal assistance, +2  for physical assistance, +2 for safety/equipment, Sit to/from stand Lower Body Dressing Details (indicate cue type and reason): maxA+2 standing balance  Toilet Transfer: Maximal assistance, +2 for physical assistance, +2 for safety/equipment, Stand-pivot Toilet Transfer Details (indicate cue type and reason): simulated in transfer to [place]landing and Hygiene: Total assistance, +2 for physical assistance, +2 for safety/equipment, Sit to/from stand Functional mobility during ADLs: Maximal assistance, +2 for physical assistance, +2 for safety/equipment(stand pivot transfers)  Cognition: Cognition Overall Cognitive Status: Impaired/Different from baseline Arousal/Alertness: Lethargic Orientation Level: Oriented to person, Disoriented to place, Disoriented to time, Disoriented to situation Attention: Focused Focused Attention: Impaired Focused Attention Impairment: Verbal basic Selective Attention: Impaired Selective Attention Impairment: Verbal basic Memory: Impaired Memory Impairment: Storage deficit, Retrieval deficit Awareness: Impaired Awareness Impairment: Intellectual impairment, Emergent impairment, Anticipatory impairment Problem Solving: Impaired Problem Solving Impairment: Verbal basic Executive Function: Sequencing, Organizing, Reasoning Reasoning: Impaired Reasoning Impairment: Verbal basic Sequencing: Impaired Organizing: Impaired Safety/Judgment: Impaired Cognition Arousal/Alertness: Lethargic Behavior During Therapy: Flat affect Overall Cognitive Status: Impaired/Different from baseline Area of Impairment: Attention, Following commands, Awareness Current Attention Level: Sustained Following Commands: Follows one step commands inconsistently, Follows one step commands with increased time General Comments: given multimodal cues and increased time pt following one step commands; pt lethargic this session but fairly easy to arouse with increased  arousal levels noted with mobility; pt attempting to verbalize/communicate with therapists but with mumbled speech at this time. responds to yes/no questions appropriately  Blood pressure (!) 149/73, pulse 77, temperature 98.6 F (  37 C), temperature source Oral, resp. rate 15, SpO2 98 %. Physical Exam  Constitutional: She appears well-developed.  Frail  HENT:  Head: Normocephalic and atraumatic.  Eyes: EOM are normal. Right eye exhibits no discharge. Left eye exhibits no discharge.  Pupils reactive to light  Neck: Normal range of motion. Neck supple. No thyromegaly present.  Cardiovascular:  Irregularly irregular  Respiratory: Effort normal and breath sounds normal. No respiratory distress.  GI: Soft. Bowel sounds are normal. She exhibits no distension.  Musculoskeletal:     Comments: No edema or tenderness in extremities  Neurological:  Lethargic  She is very dysarthric.  Follows some inconsistent commands. Facial droop Motor: Right upper extremity/right lower extremity: 0/5 proximal distal Left upper extremity:?  4/5 proximal distal Left lower extremity: Wiggles toes slightly  Psychiatric:  Unable to assess due to mentation    No results found for this or any previous visit (from the past 24 hour(s)). Mr Brain Wo Contrast  Result Date: 12/13/2018 CLINICAL DATA:  84 y/o  F; stroke follow-up. EXAM: MRI HEAD WITHOUT CONTRAST TECHNIQUE: Axial DWI, coronal DWI, axial T2 FLAIR, axial SWI sequences were acquired. COMPARISON:  12/13/2018 CTA of the head. 12/12/2018 MRI of the head. FINDINGS: Brain: Interval development of patchy foci of reduced diffusion compatible with interval acute infarction in the left thalamus, left posterior limb of internal capsule, left cerebral peduncle, left posterior hippocampus, and the left occipital lobe. Additional punctate foci of reduced diffusion are present within the left paramedian midbrain and right thalamus. Stable small focus of reduced diffusion  within the left lateral pons. Chronic hemosiderin stained infarction within the left inferomedial cerebellar hemisphere, microvascular ischemic changes of the brain, and volume loss of the brain are stable on the T2 FLAIR weighted sequence in comparison with the prior MRI of the head. Stable small focus of chronic microhemorrhage within the right temporal periventricular white matter. No mass effect, hydrocephalus, or herniation. Vascular: Stable. Skull and upper cervical spine: Negative. Sinuses/Orbits: Sphenoid sinus mucosal thickening. No additional visible abnormal signal within paranasal sinuses or the mastoid air cells. Orbits are unremarkable. Other: None. IMPRESSION: 1. Interval development of acute infarctions within the left-greater-than-right thalami, left posterior limb of internal capsule, left cerebral peduncle, left midbrain, left posterior hippocampus, and left occipital lobe. No hemorrhage or mass effect. 2. Stable small acute/early subacute infarction within the left lateral pons. 3. Stable background of chronic microvascular ischemic changes, volume loss of the brain, and chronic infarction of the left inferior cerebellum. These results will be called to the ordering clinician or representative by the Radiologist Assistant, and communication documented in the PACS or zVision Dashboard. Electronically Signed   By: Kristine Garbe M.D.   On: 12/13/2018 14:58    Assessment/Plan: Diagnosis: Bilateral, left greater than right infarcts Labs and images (see above) independently reviewed.  Records reviewed and summated above. Stroke: Continue secondary stroke prophylaxis and Risk Factor Modification listed below:   Antiplatelet therapy:   Blood Pressure Management:  Continue current medication with prn's with permisive HTN per primary team Statin Agent:   Prediabetes management:   Right greater than left sided hemiparesis: fit for orthosis to prevent contractures (resting hand splint  for day, wrist cock up splint at night, PRAFO, etc) Motor recovery: Fluoxetine  1. Does the need for close, 24 hr/day medical supervision in concert with the patient's rehab needs make it unreasonable for this patient to be served in a less intensive setting? Yes  2. Co-Morbidities requiring supervision/potential complications: vertigo (continue  meds), chronic back pain (Biofeedback training with therapies to help reduce reliance on opiate pain medications, monitor pain control during therapies, and sedation at rest and titrate to maximum efficacy to ensure participation and gains in therapies), atrial fibrillation (continue meds, monitor heart rate with increased physical activity), HTN (monitor and provide prns in accordance with increased physical exertion and pain), AKI (avoid nephrotoxic meds) 3. Due to bladder management, bowel management, safety, disease management, medication administration and patient education, does the patient require 24 hr/day rehab nursing? Yes 4. Does the patient require coordinated care of a physician, rehab nurse, PT (1-2 hrs/day, 5 days/week), OT (1-2 hrs/day, 5 days/week) and SLP (1-2 hrs/day, 5 days/week) to address physical and functional deficits in the context of the above medical diagnosis(es)? Yes Addressing deficits in the following areas: balance, endurance, locomotion, strength, transferring, bowel/bladder control, bathing, dressing, feeding, grooming, toileting, cognition, speech, language, swallowing and psychosocial support 5. Can the patient actively participate in an intensive therapy program of at least 3 hrs of therapy per day at least 5 days per week? Potentially 6. The potential for patient to make measurable gains while on inpatient rehab is excellent 7. Anticipated functional outcomes upon discharge from inpatient rehab are min assist and mod assist  with PT, min assist and mod assist with OT, min assist and mod assist with SLP. 8. Estimated rehab  length of stay to reach the above functional goals is: 23-27 days. 9. Anticipated D/C setting: Other 10. Anticipated post D/C treatments: SNF. 11. Overall Rehab/Functional Prognosis: good  RECOMMENDATIONS: This patient's condition is appropriate for continued rehabilitative care in the following setting: Per daughter, patient does not have caregiver support at discharge and she is considering SNF.  Would consider CIR if patient has caregiver support available upon discharge. Patient has agreed to participate in recommended program. Potentially Note that insurance prior authorization may be required for reimbursement for recommended care.  Comment: Rehab Admissions Coordinator to follow up.   I have personally performed a face to face diagnostic evaluation, including, but not limited to relevant history and physical exam findings, of this patient and developed relevant assessment and plan.  Additionally, I have reviewed and concur with the physician assistant's documentation above.   Delice Lesch, MD, ABPMR Lavon Paganini Angiulli, PA-C 12/15/2018

## 2018-12-15 NOTE — Progress Notes (Signed)
[facility]                                                                              Patient Demographics  Kayla Contreras, is a 84 y.o. female, DOB - 06/13/1932, OVZ:[id number]  Admit date - 12/12/2018   Admitting Physician Phillips Grout, MD  Outpatient Primary MD for the patient is Tisovec, Fransico Him, MD  Outpatient specialists:   LOS - 2  days   Medical records reviewed and are as summarized below:    Chief Complaint  Patient presents with  . Dizziness  . arm and leg jerks       Brief summary    Kayla Contreras is a 84 y.o. female with medical history significant of A. fib on Eliquis comes in with unsteady gait since 8:00 this morning.  Patient has vertigo and takes meclizine at home.  But this felt much different than her routine vertigo she denies any fevers.  She did have a sinus cold recently.  She denies any weakness in any of her extremities.  No numbness or tingling.  No slurred speech or drooling.  Patient is being referred for admission for acute infarct shown on CAT scan.  She is compliant with her Eliquis she takes no other aspirin products.  Patient is being referred for admission for acute stroke.  She still reports that she feels like her limbs are heavy still  Assessment & Plan    Principal Problem: Acute multiple embolic CVA -In the setting of known A. fib, presented with acute vertigo, earlier this morning noted to be more somnolent, with right upper extremity drift, right facial droop and dysarthria. -MRI 12/17 showed left lateral pontine infarct, small vessel disease, old left and right cerebellar infarcts -CTA head and neck no large vessel occlusion, occluded left PCA -2D echo showed EF of 45 to 50% with diffuse hypokinesis no PFO -Neurology consulted, recommended resume home Eliquis and add low-dose aspirin -Repeat MRI on 12/18 showed interval treatment of acute infarction in the left greater than right thalami, left posterior limb  of internal capsule, left cerebral peduncle, left midbrain, left posterior hippocampus and left occipital lobe, no hemorrhage or mass-effect.  Some stable small acute/early subacute infarction in the left lateral pons -LDL 113, continue pravastatin -Hemoglobin A1c 5.8 -PT recommended CIR, consult placed -Per neurology recommendations, continue Eliquis and added baby aspirin for stroke prevention, continue pravastatin avoid low BP -Much more alert and awake today, oriented and following commands, significant right-sided weakness  Active Problems: Chronic atrial fibrillation (HCC) with bradycardia -Much improved, continue to hold Coreg and diltiazem.  If RVR develops, will place on metoprolol 25 twice daily.  [facility] cardiology commendations.  -Cardiology consulted, continue aspirin, Eliquis.   Hyperlipidemia LDL 113, continue pravastatin  Code Status: Full code DVT Prophylaxis: Eliquis Family Communication: Discussed in detail with the patient, all imaging results, lab results explained to the patient's daughter at the bedside   Disposition Plan: Continue PT, skilled nursing facility versus CIR, possibly next 24 to 48 hours  Time Spent in minutes  35   Procedures:  MRI brain, 2D echo  Consultants:   Neurology  Antimicrobials:      Medications  Scheduled Meds: .  stroke: mapping our early stages of recovery book   Does not apply Once  . ALPRAZolam  0.5 mg Oral BID  . apixaban  2.5 mg Oral BID  . aspirin EC  81 mg Oral Daily  . bumetanide  2 mg Oral Daily  . ferrous sulfate  325 mg Oral Q breakfast  . losartan  50 mg Oral BID  . magnesium oxide  400 mg Oral BID  . multivitamin  1 tablet Oral Q1200  . multivitamin with minerals  1 tablet Oral Daily  . potassium chloride  10 mEq Oral BID  . pravastatin  40 mg Oral q1800  . vitamin B-12   Oral Daily   Continuous Infusions: PRN Meds:.acetaminophen **OR** acetaminophen (TYLENOL) oral liquid 160 mg/5 mL **OR**  acetaminophen   Antibiotics   Anti-infectives (From admission, onward)   None        Subjective:   Kayla Contreras was seen and examined today.  No new issues overnight.  Slightly more alert today, able to open her eyes and smile.  Other than that does not follow commands.  This morning having bradycardia with sinus pauses ~ 3 seconds, asymptomatic  Objective:   Vitals:   12/14/18 2325 12/15/18 0400 12/15/18 0719 12/15/18 1223  BP: (!) 159/75 (!) 149/73 (!) 154/70 (!) 162/58  Pulse: 87 77 74 78  Resp: 17 15 16 16   Temp: 98.2 F (36.8 C) 98.6 F (37 C) 98.9 F (37.2 C) 98.9 F (37.2 C)  TempSrc: Oral Oral Axillary Oral  SpO2: 98% 98% 98% 100%   No intake or output data in the 24 hours ending 12/15/18 1224   Wt Readings from Last 3 Encounters:  09/02/18 57.6 kg     Physical Exam  General: Much more alert and oriented today, following commands  Eyes:   HEENT:    Cardiovascular: S1 S2 auscultated. Regular rate and rhythm. No pedal edema b/l  Respiratory: Fairly CTA B  Gastrointestinal: Soft, nontender, nondistended, + bowel sounds  Ext: no pedal edema bilaterally  Neuro: Right upper extremity 1/5, RLE 2/5, left side 5/5  Musculoskeletal: No digital cyanosis, clubbing  Skin: No rashes  Psych: Much more alert and oriented today    Data Reviewed:  I have personally reviewed following labs and imaging studies  Micro Results No results found for this or any previous visit (from the past 240 hour(s)).  Radiology Reports Ct Angio Head W Or Wo Contrast  Result Date: 12/13/2018 CLINICAL DATA:  Unresponsive patient EXAM: CT ANGIOGRAPHY HEAD AND NECK TECHNIQUE: Multidetector CT imaging of the head and neck was performed using the standard protocol during bolus administration of intravenous contrast. Multiplanar CT image reconstructions and MIPs were obtained to evaluate the vascular anatomy. Carotid stenosis measurements (when applicable) are obtained  utilizing NASCET criteria, using the distal internal carotid diameter as the denominator. CONTRAST:  43mL ISOVUE-370 IOPAMIDOL (ISOVUE-370) INJECTION 76% COMPARISON:  Head CT 12/12/2018 FINDINGS: CTA NECK FINDINGS SKELETON: There is no bony spinal canal stenosis. No lytic or blastic lesion. OTHER NECK: Normal pharynx, larynx and major salivary glands. No cervical lymphadenopathy. Unremarkable thyroid gland. UPPER CHEST: No pneumothorax or pleural effusion. No nodules or masses. AORTIC ARCH: There is no calcific atherosclerosis of the aortic arch. There is no aneurysm, dissection or hemodynamically significant stenosis of the visualized ascending aorta and aortic arch. Conventional 3 vessel aortic branching pattern. There is moderate stenosis of the proximal left subclavian artery.  RIGHT CAROTID SYSTEM: --Common carotid artery: Widely patent origin without common carotid artery dissection or aneurysm. --Internal carotid artery: Normal without aneurysm, dissection or stenosis. --External carotid artery: No acute abnormality. LEFT CAROTID SYSTEM: --Common carotid artery: Widely patent origin without common carotid artery dissection or aneurysm. --Internal carotid artery: Normal without aneurysm, dissection or stenosis. --External carotid artery: No acute abnormality. VERTEBRAL ARTERIES: Left dominant configuration. Both origins are normal. No dissection, occlusion or flow-limiting stenosis to the vertebrobasilar confluence. CTA HEAD FINDINGS ANTERIOR CIRCULATION: --Intracranial internal carotid arteries: Normal. --Anterior cerebral arteries: Normal. Both A1 segments are present. Patent anterior communicating artery. --Middle cerebral arteries: Normal. --Posterior communicating arteries: Absent bilaterally. POSTERIOR CIRCULATION: --Basilar artery: Normal. --Posterior cerebral arteries: Normal right PCA. The left PCA is occluded, age indeterminate. --Superior cerebellar arteries: Normal. --Inferior cerebellar arteries:  Normal anterior and posterior inferior cerebellar arteries. VENOUS SINUSES: As permitted by contrast timing, patent. ANATOMIC VARIANTS: None DELAYED PHASE: No parenchymal contrast enhancement. Review of the MIP images confirms the above findings. IMPRESSION: 1. No emergent large vessel occlusion. 2. Occlusion of the left PCA, age indeterminate. 3. Moderate stenosis of the proximal left subclavian artery. Electronically Signed   By: Ulyses Jarred M.D.   On: 12/13/2018 04:34   Ct Head Wo Contrast  Result Date: 12/12/2018 CLINICAL DATA:  Altered mental status. Unresponsive. Recent stroke. EXAM: CT HEAD WITHOUT CONTRAST TECHNIQUE: Contiguous axial images were obtained from the base of the skull through the vertex without intravenous contrast. COMPARISON:  Head CT 12/12/2018 FINDINGS: Brain: Unchanged appearance of old left cerebellar hemisphere infarct. No edema within the pons. No acute hemorrhage. No mass effect. There is generalized atrophy without lobar predilection. There is hypoattenuation of the periventricular white matter, most commonly indicating chronic ischemic microangiopathy. Vascular: No abnormal hyperdensity of the major intracranial arteries or dural venous sinuses. No intracranial atherosclerosis. Skull: The visualized skull base, calvarium and extracranial soft tissues are normal. Sinuses/Orbits: Partial opacification of the sphenoid sinus. The orbits are normal. IMPRESSION: Unchanged examination without hemorrhage or mass effect. No pontine edema. Electronically Signed   By: Ulyses Jarred M.D.   On: 12/12/2018 23:30   Ct Head Wo Contrast  Result Date: 12/12/2018 CLINICAL DATA:  84 year old female with episode of vertigo this morning upon waking. Initial encounter. EXAM: CT HEAD WITHOUT CONTRAST TECHNIQUE: Contiguous axial images were obtained from the base of the skull through the vertex without intravenous contrast. COMPARISON:  None. FINDINGS: Brain: Small acute inferior left cerebellar  (posterior inferior cerebellar artery distribution) infarct. Secondary to streak artifact from calvarium its difficult to evaluate for the possibility of tiny amount of blood. No obvious blood identified. Chronic microvascular changes. Remote small left basal ganglia infarct. No intracranial mass lesion noted on this unenhanced exam. Vascular: Atherosclerotic changes vertebral arteries and carotid arteries. No acute hyperdense vessel. Skull: No acute abnormality. Sinuses/Orbits: Post lens replacement. Moderate mucosal thickening/opacification right sphenoid sinus. Minimal mucosal thickening left sphenoid sinus. Other: Mastoid air cells and middle ear cavities are clear. IMPRESSION: 1. Small acute inferior left cerebellar (posterior inferior cerebellar artery distribution) infarct. Secondary to streak artifact from calvarium its difficult to evaluate for the possibility of tiny amount of blood. No obvious blood identified. 2. Chronic microvascular changes. Remote small left basal ganglia infarct. 3. Moderate mucosal thickening/opacification right sphenoid sinus. Minimal mucosal thickening left sphenoid sinus. These results were called by telephone at the time of interpretation on 12/12/2018 at 5:35 pm to [facility] , who verbally acknowledged these results. Electronically Signed   By: Remo Lipps  Jeannine Kitten M.D.   On: 12/12/2018 17:40   Ct Angio Neck W Or Wo Contrast  Result Date: 12/13/2018 CLINICAL DATA:  Unresponsive patient EXAM: CT ANGIOGRAPHY HEAD AND NECK TECHNIQUE: Multidetector CT imaging of the head and neck was performed using the standard protocol during bolus administration of intravenous contrast. Multiplanar CT image reconstructions and MIPs were obtained to evaluate the vascular anatomy. Carotid stenosis measurements (when applicable) are obtained utilizing NASCET criteria, using the distal internal carotid diameter as the denominator. CONTRAST:  74mL ISOVUE-370 IOPAMIDOL (ISOVUE-370) INJECTION 76%  COMPARISON:  Head CT 12/12/2018 FINDINGS: CTA NECK FINDINGS SKELETON: There is no bony spinal canal stenosis. No lytic or blastic lesion. OTHER NECK: Normal pharynx, larynx and major salivary glands. No cervical lymphadenopathy. Unremarkable thyroid gland. UPPER CHEST: No pneumothorax or pleural effusion. No nodules or masses. AORTIC ARCH: There is no calcific atherosclerosis of the aortic arch. There is no aneurysm, dissection or hemodynamically significant stenosis of the visualized ascending aorta and aortic arch. Conventional 3 vessel aortic branching pattern. There is moderate stenosis of the proximal left subclavian artery. RIGHT CAROTID SYSTEM: --Common carotid artery: Widely patent origin without common carotid artery dissection or aneurysm. --Internal carotid artery: Normal without aneurysm, dissection or stenosis. --External carotid artery: No acute abnormality. LEFT CAROTID SYSTEM: --Common carotid artery: Widely patent origin without common carotid artery dissection or aneurysm. --Internal carotid artery: Normal without aneurysm, dissection or stenosis. --External carotid artery: No acute abnormality. VERTEBRAL ARTERIES: Left dominant configuration. Both origins are normal. No dissection, occlusion or flow-limiting stenosis to the vertebrobasilar confluence. CTA HEAD FINDINGS ANTERIOR CIRCULATION: --Intracranial internal carotid arteries: Normal. --Anterior cerebral arteries: Normal. Both A1 segments are present. Patent anterior communicating artery. --Middle cerebral arteries: Normal. --Posterior communicating arteries: Absent bilaterally. POSTERIOR CIRCULATION: --Basilar artery: Normal. --Posterior cerebral arteries: Normal right PCA. The left PCA is occluded, age indeterminate. --Superior cerebellar arteries: Normal. --Inferior cerebellar arteries: Normal anterior and posterior inferior cerebellar arteries. VENOUS SINUSES: As permitted by contrast timing, patent. ANATOMIC VARIANTS: None DELAYED PHASE:  No parenchymal contrast enhancement. Review of the MIP images confirms the above findings. IMPRESSION: 1. No emergent large vessel occlusion. 2. Occlusion of the left PCA, age indeterminate. 3. Moderate stenosis of the proximal left subclavian artery. Electronically Signed   By: Ulyses Jarred M.D.   On: 12/13/2018 04:34   Mr Brain Wo Contrast  Result Date: 12/13/2018 CLINICAL DATA:  84 y/o  F; stroke follow-up. EXAM: MRI HEAD WITHOUT CONTRAST TECHNIQUE: Axial DWI, coronal DWI, axial T2 FLAIR, axial SWI sequences were acquired. COMPARISON:  12/13/2018 CTA of the head. 12/12/2018 MRI of the head. FINDINGS: Brain: Interval development of patchy foci of reduced diffusion compatible with interval acute infarction in the left thalamus, left posterior limb of internal capsule, left cerebral peduncle, left posterior hippocampus, and the left occipital lobe. Additional punctate foci of reduced diffusion are present within the left paramedian midbrain and right thalamus. Stable small focus of reduced diffusion within the left lateral pons. Chronic hemosiderin stained infarction within the left inferomedial cerebellar hemisphere, microvascular ischemic changes of the brain, and volume loss of the brain are stable on the T2 FLAIR weighted sequence in comparison with the prior MRI of the head. Stable small focus of chronic microhemorrhage within the right temporal periventricular white matter. No mass effect, hydrocephalus, or herniation. Vascular: Stable. Skull and upper cervical spine: Negative. Sinuses/Orbits: Sphenoid sinus mucosal thickening. No additional visible abnormal signal within paranasal sinuses or the mastoid air cells. Orbits are unremarkable. Other: None. IMPRESSION: 1. Interval development  of acute infarctions within the left-greater-than-right thalami, left posterior limb of internal capsule, left cerebral peduncle, left midbrain, left posterior hippocampus, and left occipital lobe. No hemorrhage or mass  effect. 2. Stable small acute/early subacute infarction within the left lateral pons. 3. Stable background of chronic microvascular ischemic changes, volume loss of the brain, and chronic infarction of the left inferior cerebellum. These results will be called to the ordering clinician or representative by the Radiologist Assistant, and communication documented in the PACS or zVision Dashboard. Electronically Signed   By: Kristine Garbe M.D.   On: 12/13/2018 14:58   Mr Brain Wo Contrast  Result Date: 12/12/2018 CLINICAL DATA:  84 y/o F; episodes of vertigo. Evaluation for stroke. EXAM: MRI HEAD WITHOUT CONTRAST TECHNIQUE: Multiplanar, multiecho pulse sequences of the brain and surrounding structures were obtained without intravenous contrast. COMPARISON:  12/12/2018 CT head. FINDINGS: Brain: 5 mm focus of reduced diffusion within the left lateral pons compatible with acute/early subacute infarction (series 5, image 61 and series 7, image 48). No associated hemorrhage or mass effect. Chronic hemosiderin stained infarction of the left inferomedial cerebellar hemisphere with laminar necrosis. Very small chronic infarction of the right inferior cerebellar hemisphere. Several nonspecific T2 FLAIR hyperintensities in subcortical and periventricular white matter are compatible with moderate chronic microvascular ischemic changes for age. Moderate volume loss of the brain. Additional punctate focus of susceptibility hypointensity is present within the right temporal periventricular white matter compatible with hemosiderin deposition of chronic hemorrhage. No acute extra-axial collection, hydrocephalus, mass effect, or herniation. Vascular: Normal flow voids. Skull and upper cervical spine: Normal marrow signal. Sinuses/Orbits: Moderate mucosal thickening of the right sphenoid sinus. No abnormal signal of the additional visible paranasal sinuses and the mastoid air cells. Bilateral intra-ocular lens  replacement. Other: None. IMPRESSION: 1. 5 mm acute/early subacute infarction within the left lateral pons. No associated hemorrhage or mass effect. 2. Small chronic infarct in the left inferior medial cerebellar hemisphere and very small chronic infarction in the right inferior cerebellar hemisphere. 3. Moderate chronic microvascular ischemic changes and volume loss of the brain. 4. Sphenoid sinus disease. These results will be called to the ordering clinician or representative by the Radiologist Assistant, and communication documented in the PACS or zVision Dashboard. Electronically Signed   By: Kristine Garbe M.D.   On: 12/12/2018 21:06    Lab Data:  CBC: Recent Labs  Lab 12/12/18 1346  WBC 6.1  HGB 12.0  HCT 38.0  MCV 92.0  PLT 401   Basic Metabolic Panel: Recent Labs  Lab 12/12/18 1346  NA 139  K 3.9  CL 103  CO2 23  GLUCOSE 211*  BUN 18  CREATININE 1.02*  CALCIUM 9.3   GFR: CrCl cannot be calculated (Unknown ideal weight.). Liver Function Tests: No results for input(s): AST, ALT, ALKPHOS, BILITOT, PROT, ALBUMIN in the last 168 hours. No results for input(s): LIPASE, AMYLASE in the last 168 hours. No results for input(s): AMMONIA in the last 168 hours. Coagulation Profile: No results for input(s): INR, PROTIME in the last 168 hours. Cardiac Enzymes: No results for input(s): CKTOTAL, CKMB, CKMBINDEX, TROPONINI in the last 168 hours. BNP (last 3 results) No results for input(s): PROBNP in the last 8760 hours. HbA1C: Recent Labs    12/13/18 0427  HGBA1C 5.8*   CBG: Recent Labs  Lab 12/12/18 1701  GLUCAP 100*   Lipid Profile: Recent Labs    12/13/18 0427  CHOL 174  HDL 43  LDLCALC 113*  TRIG 90  CHOLHDL 4.0   Thyroid Function Tests: No results for input(s): TSH, T4TOTAL, FREET4, T3FREE, THYROIDAB in the last 72 hours. Anemia Panel: No results for input(s): VITAMINB12, FOLATE, FERRITIN, TIBC, IRON, RETICCTPCT in the last 72 hours. Urine  analysis:    Component Value Date/Time   COLORURINE STRAW (A) 12/12/2018 1344   APPEARANCEUR CLEAR 12/12/2018 1344   LABSPEC 1.003 (L) 12/12/2018 1344   PHURINE 7.0 12/12/2018 1344   GLUCOSEU 50 (A) 12/12/2018 1344   HGBUR NEGATIVE 12/12/2018 1344   BILIRUBINUR NEGATIVE 12/12/2018 1344   KETONESUR NEGATIVE 12/12/2018 1344   PROTEINUR NEGATIVE 12/12/2018 1344   NITRITE NEGATIVE 12/12/2018 1344   LEUKOCYTESUR NEGATIVE 12/12/2018 1344     Ranger Petrich M.D. [facility] 12/15/2018, 12:24 PM  Pager: [phone] Between 7am to 7pm - call Pager - [phone]  After 7pm go to [email or web address] - password [email or web address]  Call night coverage person covering after 7pm

## 2018-12-16 ENCOUNTER — Inpatient Hospital Stay (HOSPITAL_COMMUNITY): Payer: Medicare Other

## 2018-12-16 NOTE — Progress Notes (Signed)
Modified Barium Swallow Progress Note  Patient Details  Name: Kayla Contreras MRN: [id number] Date of Birth: 04-May-1932  Today's Date: 12/16/2018  Modified Barium Swallow completed.  Full report located under Chart Review in the Imaging Section.  Brief recommendations include the following:  Clinical Impression  Pt presents with primary oral dysphagia, with premature spillage of liquids resulting in penetration of thin, nectar thick liquids before the swallow. Swallow initiation more timely with honey-thick liquids, usually at the valleculae, with good airway protection. Prolonged/weak mastication and decreased bolus cohesion with solids, and there is moderate lingual residue and in the right lateral sulcus. Lingual residue cleared with cued swallow and sip of honey-thick liquid; SLP cleared sulcus with finger sweep. Mild lingual weakness, with intermittent mild residue in the valleculae and pyriforms, which clears with second swallow (sometimes volitional). Recommend she continue with dys 2, honey-thick liquids, meds crushed in puree, with good prognosis for advancement with improvements in oral control. Family present and educated re: findings, including use of pillows which were need for positioning pt upright/head forward posture. Recommend teaspoon trials of thinner consistencies with SLP.   Swallow Evaluation Recommendations       SLP Diet Recommendations: Dysphagia 2 (Fine chop) solids;Honey thick liquids   Liquid Administration via: Cup   Medication Administration: Crushed with puree   Supervision: Staff to assist with self feeding;Full supervision/cueing for compensatory strategies   Compensations: Slow rate;Small sips/bites;Minimize environmental distractions;Follow solids with liquid       Oral Care Recommendations: Oral care BID   Other Recommendations: Order thickener from [place], [place], [place] Pager: [phone] Office: [phone]  Aliene Altes 12/16/2018,1:29 PM

## 2018-12-16 NOTE — Progress Notes (Signed)
[facility]                                                                              Patient Demographics  Kayla Contreras, is a 84 y.o. female, DOB - 07-02-1932, SFK:[id number]  Admit date - 12/12/2018   Admitting Physician Phillips Grout, MD  Outpatient Primary MD for the patient is Tisovec, Fransico Him, MD  Outpatient specialists:   LOS - 3  days   Medical records reviewed and are as summarized below:    Chief Complaint  Patient presents with  . Dizziness  . arm and leg jerks       Brief summary    Kayla Contreras is a 84 y.o. female with medical history significant of A. fib on Eliquis comes in with unsteady gait since 8:00 this morning.  Patient has vertigo and takes meclizine at home.  But this felt much different than her routine vertigo she denies any fevers.  She did have a sinus cold recently.  She denies any weakness in any of her extremities.  No numbness or tingling.  No slurred speech or drooling.  Patient is being referred for admission for acute infarct shown on CAT scan.  She is compliant with her Eliquis she takes no other aspirin products.  Patient is being referred for admission for acute stroke.  She still reports that she feels like her limbs are heavy still  Assessment & Plan    Principal Problem: Acute multiple embolic CVA -In the setting of known A. fib, presented with acute vertigo, earlier this morning noted to be more somnolent, with right upper extremity drift, right facial droop and dysarthria. -MRI 12/17 showed left lateral pontine infarct, small vessel disease, old left and right cerebellar infarcts -CTA head and neck no large vessel occlusion, occluded left PCA -2D echo showed EF of 45 to 50% with diffuse hypokinesis no PFO -Neurology consulted, recommended resume home Eliquis and add low-dose aspirin -Repeat MRI on 12/18 showed interval treatment of acute infarction in the left greater than right thalami, left posterior limb  of internal capsule, left cerebral peduncle, left midbrain, left posterior hippocampus and left occipital lobe, no hemorrhage or mass-effect.  Some stable small acute/early subacute infarction in the left lateral pons -LDL 113, continue pravastatin -Hemoglobin A1c 5.8 -PT recommended CIR, consult placed -Per neurology recommendations, continue Eliquis and added baby aspirin for stroke prevention, continue pravastatin avoid low BP -continues to improve with mental status   Residual dysphagia from stroke -SLP following, passed swallow test, started on dysphagia 1 diet   Chronic atrial fibrillation (HCC) with bradycardia -Much improved, continue to hold Coreg and diltiazem.  If RVR develops, will place on metoprolol 25 twice daily.  [place] [facility] commendations.  -Cardiology consulted, continue aspirin, Eliquis.  -Patient had 5 beats run of V. tach today, asymptomatic, if repeat episodes, will restart metoprolol.   Hyperlipidemia LDL 113, continue pravastatin  Code Status: Full code DVT Prophylaxis: Eliquis Family Communication: Discussed in detail with the patient, all imaging results, lab results explained to the patient's daughter at the bedside   Disposition Plan: Continue PT, skilled nursing facility versus CIR, possibly  next 24 to 48 hours  Time Spent in minutes 25 minutes  Procedures:  MRI brain, 2D echo  Consultants:   Neurology  Antimicrobials:      Medications  Scheduled Meds: .  stroke: mapping our early stages of recovery book   Does not apply Once  . ALPRAZolam  0.5 mg Oral BID  . apixaban  2.5 mg Oral BID  . aspirin EC  81 mg Oral Daily  . bumetanide  2 mg Oral Daily  . ferrous sulfate  325 mg Oral Q breakfast  . losartan  50 mg Oral BID  . magnesium oxide  400 mg Oral BID  . multivitamin  1 tablet Oral Q1200  . multivitamin with minerals  1 tablet Oral Daily  . potassium chloride  10 mEq Oral BID  . pravastatin  40 mg Oral q1800  . vitamin  B-12   Oral Daily   Continuous Infusions: PRN Meds:.acetaminophen **OR** acetaminophen (TYLENOL) oral liquid 160 mg/5 mL **OR** acetaminophen   Antibiotics   Anti-infectives (From admission, onward)   None        Subjective:   Kayla Contreras was seen and examined today.  No acute issues, much more alert and oriented however right-sided weakness still persists.  No further bradycardia episodes.  No chest pain, shortness of breath  Objective:   Vitals:   12/15/18 2319 12/16/18 0339 12/16/18 0714 12/16/18 1107  BP: 134/68 (!) 149/59 (!) 160/72 125/75  Pulse: 74 73 70 67  Resp:  17 20 18   Temp: 98.7 F (37.1 C) 98.4 F (36.9 C) 98.2 F (36.8 C) 98.6 F (37 C)  TempSrc: Oral Oral Oral Axillary  SpO2: 98% 96% 98% 96%    Intake/Output Summary (Last 24 hours) at 12/16/2018 1413 Last data filed at 12/16/2018 1306 Gross per 24 hour  Intake -  Output 400 ml  Net -400 ml     Wt Readings from Last 3 Encounters:  09/02/18 57.6 kg   Physical Exam  General: Alert and oriented x 3, NAD  Eyes:   HEENT:   Cardiovascular: S1 S2 auscultated, RRRNo pedal edema b/l  Respiratory: Clear to auscultation bilaterally, no wheezing, rales or rhonchi  Gastrointestinal: Soft, nontender, nondistended, + bowel sounds  Ext: no pedal edema bilaterally  Neuro: Right-sided weakness still persist 1/5, left side 5/5  musculoskeletal: No digital cyanosis, clubbing  Skin: No rashes  Psych: Normal affect and demeanor, alert and oriented x3   Data Reviewed:  I have personally reviewed following labs and imaging studies  Micro Results No results found for this or any previous visit (from the past 240 hour(s)).  Radiology Reports Ct Angio Head W Or Wo Contrast  Result Date: 12/13/2018 CLINICAL DATA:  Unresponsive patient EXAM: CT ANGIOGRAPHY HEAD AND NECK TECHNIQUE: Multidetector CT imaging of the head and neck was performed using the standard protocol during bolus administration of  intravenous contrast. Multiplanar CT image reconstructions and MIPs were obtained to evaluate the vascular anatomy. Carotid stenosis measurements (when applicable) are obtained utilizing NASCET criteria, using the distal internal carotid diameter as the denominator. CONTRAST:  4mL ISOVUE-370 IOPAMIDOL (ISOVUE-370) INJECTION 76% COMPARISON:  Head CT 12/12/2018 FINDINGS: CTA NECK FINDINGS SKELETON: There is no bony spinal canal stenosis. No lytic or blastic lesion. OTHER NECK: Normal pharynx, larynx and major salivary glands. No cervical lymphadenopathy. Unremarkable thyroid gland. UPPER CHEST: No pneumothorax or pleural effusion. No nodules or masses. AORTIC ARCH: There is no calcific atherosclerosis of the aortic arch. There is no aneurysm,  dissection or hemodynamically significant stenosis of the visualized ascending aorta and aortic arch. Conventional 3 vessel aortic branching pattern. There is moderate stenosis of the proximal left subclavian artery. RIGHT CAROTID SYSTEM: --Common carotid artery: Widely patent origin without common carotid artery dissection or aneurysm. --Internal carotid artery: Normal without aneurysm, dissection or stenosis. --External carotid artery: No acute abnormality. LEFT CAROTID SYSTEM: --Common carotid artery: Widely patent origin without common carotid artery dissection or aneurysm. --Internal carotid artery: Normal without aneurysm, dissection or stenosis. --External carotid artery: No acute abnormality. VERTEBRAL ARTERIES: Left dominant configuration. Both origins are normal. No dissection, occlusion or flow-limiting stenosis to the vertebrobasilar confluence. CTA HEAD FINDINGS ANTERIOR CIRCULATION: --Intracranial internal carotid arteries: Normal. --Anterior cerebral arteries: Normal. Both A1 segments are present. Patent anterior communicating artery. --Middle cerebral arteries: Normal. --Posterior communicating arteries: Absent bilaterally. POSTERIOR CIRCULATION: --Basilar  artery: Normal. --Posterior cerebral arteries: Normal right PCA. The left PCA is occluded, age indeterminate. --Superior cerebellar arteries: Normal. --Inferior cerebellar arteries: Normal anterior and posterior inferior cerebellar arteries. VENOUS SINUSES: As permitted by contrast timing, patent. ANATOMIC VARIANTS: None DELAYED PHASE: No parenchymal contrast enhancement. Review of the MIP images confirms the above findings. IMPRESSION: 1. No emergent large vessel occlusion. 2. Occlusion of the left PCA, age indeterminate. 3. Moderate stenosis of the proximal left subclavian artery. Electronically Signed   By: Ulyses Jarred M.D.   On: 12/13/2018 04:34   Ct Head Wo Contrast  Result Date: 12/12/2018 CLINICAL DATA:  Altered mental status. Unresponsive. Recent stroke. EXAM: CT HEAD WITHOUT CONTRAST TECHNIQUE: Contiguous axial images were obtained from the base of the skull through the vertex without intravenous contrast. COMPARISON:  Head CT 12/12/2018 FINDINGS: Brain: Unchanged appearance of old left cerebellar hemisphere infarct. No edema within the pons. No acute hemorrhage. No mass effect. There is generalized atrophy without lobar predilection. There is hypoattenuation of the periventricular white matter, most commonly indicating chronic ischemic microangiopathy. Vascular: No abnormal hyperdensity of the major intracranial arteries or dural venous sinuses. No intracranial atherosclerosis. Skull: The visualized skull base, calvarium and extracranial soft tissues are normal. Sinuses/Orbits: Partial opacification of the sphenoid sinus. The orbits are normal. IMPRESSION: Unchanged examination without hemorrhage or mass effect. No pontine edema. Electronically Signed   By: Ulyses Jarred M.D.   On: 12/12/2018 23:30   Ct Head Wo Contrast  Result Date: 12/12/2018 CLINICAL DATA:  84 year old female with episode of vertigo this morning upon waking. Initial encounter. EXAM: CT HEAD WITHOUT CONTRAST TECHNIQUE:  Contiguous axial images were obtained from the base of the skull through the vertex without intravenous contrast. COMPARISON:  None. FINDINGS: Brain: Small acute inferior left cerebellar (posterior inferior cerebellar artery distribution) infarct. Secondary to streak artifact from calvarium its difficult to evaluate for the possibility of tiny amount of blood. No obvious blood identified. Chronic microvascular changes. Remote small left basal ganglia infarct. No intracranial mass lesion noted on this unenhanced exam. Vascular: Atherosclerotic changes vertebral arteries and carotid arteries. No acute hyperdense vessel. Skull: No acute abnormality. Sinuses/Orbits: Post lens replacement. Moderate mucosal thickening/opacification right sphenoid sinus. Minimal mucosal thickening left sphenoid sinus. Other: Mastoid air cells and middle ear cavities are clear. IMPRESSION: 1. Small acute inferior left cerebellar (posterior inferior cerebellar artery distribution) infarct. Secondary to streak artifact from calvarium its difficult to evaluate for the possibility of tiny amount of blood. No obvious blood identified. 2. Chronic microvascular changes. Remote small left basal ganglia infarct. 3. Moderate mucosal thickening/opacification right sphenoid sinus. Minimal mucosal thickening left sphenoid sinus. These results were  called by telephone at the time of interpretation on 12/12/2018 at 5:35 pm to [facility] , who verbally acknowledged these results. Electronically Signed   By: Genia Del M.D.   On: 12/12/2018 17:40   Ct Angio Neck W Or Wo Contrast  Result Date: 12/13/2018 CLINICAL DATA:  Unresponsive patient EXAM: CT ANGIOGRAPHY HEAD AND NECK TECHNIQUE: Multidetector CT imaging of the head and neck was performed using the standard protocol during bolus administration of intravenous contrast. Multiplanar CT image reconstructions and MIPs were obtained to evaluate the vascular anatomy. Carotid stenosis measurements  (when applicable) are obtained utilizing NASCET criteria, using the distal internal carotid diameter as the denominator. CONTRAST:  29mL ISOVUE-370 IOPAMIDOL (ISOVUE-370) INJECTION 76% COMPARISON:  Head CT 12/12/2018 FINDINGS: CTA NECK FINDINGS SKELETON: There is no bony spinal canal stenosis. No lytic or blastic lesion. OTHER NECK: Normal pharynx, larynx and major salivary glands. No cervical lymphadenopathy. Unremarkable thyroid gland. UPPER CHEST: No pneumothorax or pleural effusion. No nodules or masses. AORTIC ARCH: There is no calcific atherosclerosis of the aortic arch. There is no aneurysm, dissection or hemodynamically significant stenosis of the visualized ascending aorta and aortic arch. Conventional 3 vessel aortic branching pattern. There is moderate stenosis of the proximal left subclavian artery. RIGHT CAROTID SYSTEM: --Common carotid artery: Widely patent origin without common carotid artery dissection or aneurysm. --Internal carotid artery: Normal without aneurysm, dissection or stenosis. --External carotid artery: No acute abnormality. LEFT CAROTID SYSTEM: --Common carotid artery: Widely patent origin without common carotid artery dissection or aneurysm. --Internal carotid artery: Normal without aneurysm, dissection or stenosis. --External carotid artery: No acute abnormality. VERTEBRAL ARTERIES: Left dominant configuration. Both origins are normal. No dissection, occlusion or flow-limiting stenosis to the vertebrobasilar confluence. CTA HEAD FINDINGS ANTERIOR CIRCULATION: --Intracranial internal carotid arteries: Normal. --Anterior cerebral arteries: Normal. Both A1 segments are present. Patent anterior communicating artery. --Middle cerebral arteries: Normal. --Posterior communicating arteries: Absent bilaterally. POSTERIOR CIRCULATION: --Basilar artery: Normal. --Posterior cerebral arteries: Normal right PCA. The left PCA is occluded, age indeterminate. --Superior cerebellar arteries: Normal.  --Inferior cerebellar arteries: Normal anterior and posterior inferior cerebellar arteries. VENOUS SINUSES: As permitted by contrast timing, patent. ANATOMIC VARIANTS: None DELAYED PHASE: No parenchymal contrast enhancement. Review of the MIP images confirms the above findings. IMPRESSION: 1. No emergent large vessel occlusion. 2. Occlusion of the left PCA, age indeterminate. 3. Moderate stenosis of the proximal left subclavian artery. Electronically Signed   By: Ulyses Jarred M.D.   On: 12/13/2018 04:34   Mr Brain Wo Contrast  Result Date: 12/13/2018 CLINICAL DATA:  84 y/o  F; stroke follow-up. EXAM: MRI HEAD WITHOUT CONTRAST TECHNIQUE: Axial DWI, coronal DWI, axial T2 FLAIR, axial SWI sequences were acquired. COMPARISON:  12/13/2018 CTA of the head. 12/12/2018 MRI of the head. FINDINGS: Brain: Interval development of patchy foci of reduced diffusion compatible with interval acute infarction in the left thalamus, left posterior limb of internal capsule, left cerebral peduncle, left posterior hippocampus, and the left occipital lobe. Additional punctate foci of reduced diffusion are present within the left paramedian midbrain and right thalamus. Stable small focus of reduced diffusion within the left lateral pons. Chronic hemosiderin stained infarction within the left inferomedial cerebellar hemisphere, microvascular ischemic changes of the brain, and volume loss of the brain are stable on the T2 FLAIR weighted sequence in comparison with the prior MRI of the head. Stable small focus of chronic microhemorrhage within the right temporal periventricular white matter. No mass effect, hydrocephalus, or herniation. Vascular: Stable. Skull and upper  cervical spine: Negative. Sinuses/Orbits: Sphenoid sinus mucosal thickening. No additional visible abnormal signal within paranasal sinuses or the mastoid air cells. Orbits are unremarkable. Other: None. IMPRESSION: 1. Interval development of acute infarctions within the  left-greater-than-right thalami, left posterior limb of internal capsule, left cerebral peduncle, left midbrain, left posterior hippocampus, and left occipital lobe. No hemorrhage or mass effect. 2. Stable small acute/early subacute infarction within the left lateral pons. 3. Stable background of chronic microvascular ischemic changes, volume loss of the brain, and chronic infarction of the left inferior cerebellum. These results will be called to the ordering clinician or representative by the Radiologist Assistant, and communication documented in the PACS or zVision Dashboard. Electronically Signed   By: Kristine Garbe M.D.   On: 12/13/2018 14:58   Mr Brain Wo Contrast  Result Date: 12/12/2018 CLINICAL DATA:  84 y/o F; episodes of vertigo. Evaluation for stroke. EXAM: MRI HEAD WITHOUT CONTRAST TECHNIQUE: Multiplanar, multiecho pulse sequences of the brain and surrounding structures were obtained without intravenous contrast. COMPARISON:  12/12/2018 CT head. FINDINGS: Brain: 5 mm focus of reduced diffusion within the left lateral pons compatible with acute/early subacute infarction (series 5, image 61 and series 7, image 48). No associated hemorrhage or mass effect. Chronic hemosiderin stained infarction of the left inferomedial cerebellar hemisphere with laminar necrosis. Very small chronic infarction of the right inferior cerebellar hemisphere. Several nonspecific T2 FLAIR hyperintensities in subcortical and periventricular white matter are compatible with moderate chronic microvascular ischemic changes for age. Moderate volume loss of the brain. Additional punctate focus of susceptibility hypointensity is present within the right temporal periventricular white matter compatible with hemosiderin deposition of chronic hemorrhage. No acute extra-axial collection, hydrocephalus, mass effect, or herniation. Vascular: Normal flow voids. Skull and upper cervical spine: Normal marrow signal.  Sinuses/Orbits: Moderate mucosal thickening of the right sphenoid sinus. No abnormal signal of the additional visible paranasal sinuses and the mastoid air cells. Bilateral intra-ocular lens replacement. Other: None. IMPRESSION: 1. 5 mm acute/early subacute infarction within the left lateral pons. No associated hemorrhage or mass effect. 2. Small chronic infarct in the left inferior medial cerebellar hemisphere and very small chronic infarction in the right inferior cerebellar hemisphere. 3. Moderate chronic microvascular ischemic changes and volume loss of the brain. 4. Sphenoid sinus disease. These results will be called to the ordering clinician or representative by the Radiologist Assistant, and communication documented in the PACS or zVision Dashboard. Electronically Signed   By: Kristine Garbe M.D.   On: 12/12/2018 21:06   Dg Swallowing Func-speech Pathology  Result Date: 12/16/2018 Objective Swallowing Evaluation: Type of Study: Bedside Swallow Evaluation  Patient Details Name: Kayla Contreras MRN: [id number] Date of Birth: Jul 16, 1932 Today's Date: 12/16/2018 Time: SLP Start Time (ACUTE ONLY): 1015 -SLP Stop Time (ACUTE ONLY): 1040 SLP Time Calculation (min) (ACUTE ONLY): 25 min Past Medical History: Past Medical History: Diagnosis Date . Atrial fibrillation [facility])  Past Surgical History: Past Surgical History: Procedure Laterality Date . CARDIAC CATHETERIZATION   HPI: Kayla Contreras is a 84 y.o. female past medical history of atrial fibrillation on Eliquis, BPPV in the past, anxiety, and history of vertigo. Presented to hospital with unsteady gait, no slurring of speech or swallowing problems. CT suggestive of cerebellar stroke. Patient passed the [facility]. Subsequently developed neuro changes; MRI was repeated 12/18, which revealed: Interval development of acute infarctions within the left-greater-than-right thalami, left posterior limb of internal capsule, left cerebral peduncle, left  midbrain, left posterior hippocampus, and left occipital lobe. No hemorrhage  or mass effect. 2. Stable small acute/early subacute infarction within the left lateral pons. 3. Stable background of chronic microvascular ischemic changes, volume loss of the brain, and chronic infarction of the left inferior cerebellum.   Subjective: arrives with daughter, son to fluoro Assessment / Plan / Recommendation CHL IP CLINICAL IMPRESSIONS 12/16/2018 Clinical Impression Pt presents with primary oral dysphagia, with premature spillage of liquids resulting in penetration of thin, nectar thick liquids before the swallow. Swallow initiation more timely with honey-thick liquids, usually at the valleculae, with good airway protection. Prolonged/weak mastication and decreased bolus cohesion with solids, and there is moderate lingual residue and in the right lateral sulcus. Lingual residue cleared with cued swallow and sip of honey-thick liquid; SLP cleared sulcus with finger sweep. Mild lingual weakness, with intermittent mild residue in the valleculae and pyriforms, which clears with second swallow (sometimes volitional). Recommend she continue with dys 2, honey-thick liquids, meds crushed in puree, with good prognosis for advancement with improvements in oral control. Family present and educated re: findings, including use of pillows which were need for positioning pt upright/head forward posture. Recommend teaspoon trials of thinner consistencies with SLP. SLP Visit Diagnosis Dysphagia, oral phase (R13.11) Attention and concentration deficit following -- Frontal lobe and executive function deficit following -- Impact on safety and function --   CHL IP TREATMENT RECOMMENDATION 12/16/2018 Treatment Recommendations Therapy as outlined in treatment plan below;F/U MBS in --- days (Comment)   Prognosis 12/16/2018 Prognosis for Safe Diet Advancement Good Barriers to Reach Goals Cognitive deficits Barriers/Prognosis Comment -- CHL IP DIET  RECOMMENDATION 12/16/2018 SLP Diet Recommendations Dysphagia 2 (Fine chop) solids;Honey thick liquids Liquid Administration via Cup Medication Administration Crushed with puree Compensations Slow rate;Small sips/bites;Minimize environmental distractions;Follow solids with liquid Postural Changes --   CHL IP OTHER RECOMMENDATIONS 12/16/2018 Recommended Consults -- Oral Care Recommendations Oral care BID Other Recommendations Order thickener from pharmacy   CHL IP FOLLOW UP RECOMMENDATIONS 12/16/2018 Follow up Recommendations Inpatient Rehab   CHL IP FREQUENCY AND DURATION 12/16/2018 Speech Therapy Frequency (ACUTE ONLY) min 2x/week Treatment Duration 1 week      CHL IP ORAL PHASE 12/16/2018 Oral Phase Impaired Oral - Pudding Teaspoon -- Oral - Pudding Cup -- Oral - Honey Teaspoon Decreased bolus cohesion;Premature spillage;Reduced posterior propulsion Oral - Honey Cup Decreased bolus cohesion;Reduced posterior propulsion Oral - Nectar Teaspoon Decreased bolus cohesion;Premature spillage;Reduced posterior propulsion Oral - Nectar Cup Reduced posterior propulsion;Decreased bolus cohesion;Premature spillage Oral - Nectar Straw Reduced posterior propulsion;Decreased bolus cohesion;Premature spillage Oral - Thin Teaspoon Reduced posterior propulsion;Decreased bolus cohesion;Premature spillage Oral - Thin Cup -- Oral - Thin Straw -- Oral - Puree Decreased bolus cohesion;Reduced posterior propulsion Oral - Mech Soft -- Oral - Regular Impaired mastication;Weak lingual manipulation;Reduced posterior propulsion;Right pocketing in lateral sulci;Lingual/palatal residue;Decreased bolus cohesion Oral - Multi-Consistency -- Oral - Pill -- Oral Phase - Comment --  CHL IP PHARYNGEAL PHASE 12/16/2018 Pharyngeal Phase Impaired Pharyngeal- Pudding Teaspoon -- Pharyngeal -- Pharyngeal- Pudding Cup -- Pharyngeal -- Pharyngeal- Honey Teaspoon -- Pharyngeal -- Pharyngeal- Honey Cup Delayed swallow initiation-vallecula;Pharyngeal residue -  valleculae;Pharyngeal residue - pyriform;Reduced tongue base retraction Pharyngeal -- Pharyngeal- Nectar Teaspoon -- Pharyngeal -- Pharyngeal- Nectar Cup -- Pharyngeal -- Pharyngeal- Nectar Straw Delayed swallow initiation-pyriform sinuses;Reduced tongue base retraction;Penetration/Aspiration before swallow;Pharyngeal residue - valleculae;Pharyngeal residue - pyriform Pharyngeal Material enters airway, remains ABOVE vocal cords then ejected out;Material enters airway, remains ABOVE vocal cords and not ejected out Pharyngeal- Thin Teaspoon -- Pharyngeal -- Pharyngeal- Thin Cup -- Pharyngeal -- Pharyngeal- Thin  Straw -- Pharyngeal -- Pharyngeal- Puree Delayed swallow initiation-vallecula;Reduced tongue base retraction;Pharyngeal residue - valleculae Pharyngeal -- Pharyngeal- Mechanical Soft -- Pharyngeal -- Pharyngeal- Regular Delayed swallow initiation-vallecula;Reduced tongue base retraction;Pharyngeal residue - valleculae Pharyngeal -- Pharyngeal- Multi-consistency -- Pharyngeal -- Pharyngeal- Pill -- Pharyngeal -- Pharyngeal Comment --  CH[facility] IP CERVICAL ESOPHAGEAL PHASE 12/16/2018 Cervical Esophageal Phase WFL Pudding Teaspoon -- Pudding Cup -- Honey Teaspoon -- Honey Cup -- Nectar Teaspoon -- Nectar Cup -- Nectar Straw -- Thin Teaspoon -- Thin Cup -- Thin Straw -- Puree -- Mechanical Soft -- Regular -- Multi-consistency -- Pill -- Cervical Esophageal Comment -- Kayla Lever, MS, [place] Pager: [phone] Office: [phone] Aliene Altes 12/16/2018, 1:31 PM               Lab Data:  CBC: Recent Labs  Lab 12/12/18 1346  WBC 6.1  HGB 12.0  HCT 38.0  MCV 92.0  PLT 295   Basic Metabolic Panel: Recent Labs  Lab 12/12/18 1346  NA 139  K 3.9  CL 103  CO2 23  GLUCOSE 211*  BUN 18  CREATININE 1.02*  CALCIUM 9.3   GFR: CrCl cannot be calculated (Unknown ideal weight.). Liver Function Tests: No results for input(s): AST, ALT,  ALKPHOS, BILITOT, PROT, ALBUMIN in the last 168 hours. No results for input(s): LIPASE, AMYLASE in the last 168 hours. No results for input(s): AMMONIA in the last 168 hours. Coagulation Profile: No results for input(s): INR, PROTIME in the last 168 hours. Cardiac Enzymes: No results for input(s): CKTOTAL, CKMB, CKMBINDEX, TROPONINI in the last 168 hours. BNP (last 3 results) No results for input(s): PROBNP in the last 8760 hours. HbA1C: No results for input(s): HGBA1C in the last 72 hours. CBG: Recent Labs  Lab 12/12/18 1701  GLUCAP 100*   Lipid Profile: No results for input(s): CHOL, HDL, LDLCALC, TRIG, CHOLHDL, LDLDIRECT in the last 72 hours. Thyroid Function Tests: No results for input(s): TSH, T4TOTAL, FREET4, T3FREE, THYROIDAB in the last 72 hours. Anemia Panel: No results for input(s): VITAMINB12, FOLATE, FERRITIN, TIBC, IRON, RETICCTPCT in the last 72 hours. Urine analysis:    Component Value Date/Time   COLORURINE STRAW (A) 12/12/2018 1344   APPEARANCEUR CLEAR 12/12/2018 1344   LABSPEC 1.003 (L) 12/12/2018 1344   PHURINE 7.0 12/12/2018 1344   GLUCOSEU 50 (A) 12/12/2018 1344   HGBUR NEGATIVE 12/12/2018 1344   BILIRUBINUR NEGATIVE 12/12/2018 1344   KETONESUR NEGATIVE 12/12/2018 1344   PROTEINUR NEGATIVE 12/12/2018 1344   NITRITE NEGATIVE 12/12/2018 1344   LEUKOCYTESUR NEGATIVE 12/12/2018 1344     Japleen Tornow M.D. [facility] 12/16/2018, 2:13 PM  Pager: [phone] Between 7am to 7pm - call Pager - [phone]  After 7pm go to [email or web address] - password [email or web address]  Call night coverage person covering after 7pm

## 2018-12-16 NOTE — Progress Notes (Signed)
Pt had 5 beats of VT. MD notified. Will continue to monitor

## 2018-12-17 LAB — CBC
HCT: 43.1 % (ref 36.0–46.0)
HEMOGLOBIN: 13.7 g/dL (ref 12.0–15.0)
MCH: 29.3 pg (ref 26.0–34.0)
MCHC: 31.8 g/dL (ref 30.0–36.0)
MCV: 92.1 fL (ref 80.0–100.0)
Platelets: 163 10*3/uL (ref 150–400)
RBC: 4.68 MIL/uL (ref 3.87–5.11)
RDW: 12.3 % (ref 11.5–15.5)
WBC: 6.9 10*3/uL (ref 4.0–10.5)
nRBC: 0 % (ref 0.0–0.2)

## 2018-12-17 LAB — BASIC METABOLIC PANEL
Anion gap: 10 (ref 5–15)
BUN: 24 mg/dL — ABNORMAL HIGH (ref 8–23)
CO2: 27 mmol/L (ref 22–32)
Calcium: 9.2 mg/dL (ref 8.9–10.3)
Chloride: 108 mmol/L (ref 98–111)
Creatinine, Ser: 0.89 mg/dL (ref 0.44–1.00)
GFR calc Af Amer: >60 mL/min (ref >60)
GFR calc non Af Amer: 60 mL/min — ABNORMAL LOW (ref >60)
Glucose, Bld: 133 mg/dL — ABNORMAL HIGH (ref 70–99)
Potassium: 3.9 mmol/L (ref 3.5–5.1)
Sodium: 145 mmol/L (ref 135–145)

## 2018-12-17 NOTE — Progress Notes (Signed)
[facility]                                                                              Patient Demographics  Kayla Contreras, is a 84 y.o. female, DOB - 1932-12-19, MEQ:[id number]  Admit date - 12/12/2018   Admitting Physician Phillips Grout, MD  Outpatient Primary MD for the patient is Tisovec, Fransico Him, MD  Outpatient specialists:   LOS - 4  days   Medical records reviewed and are as summarized below:    Chief Complaint  Patient presents with  . Dizziness  . arm and leg jerks       Brief summary    Kayla Contreras is a 84 y.o. female with medical history significant of A. fib on Eliquis comes in with unsteady gait since 8:00 this morning.  Patient has vertigo and takes meclizine at home.  But this felt much different than her routine vertigo she denies any fevers.  She did have a sinus cold recently.  She denies any weakness in any of her extremities.  No numbness or tingling.  No slurred speech or drooling.  Patient is being referred for admission for acute infarct shown on CAT scan.  She is compliant with her Eliquis she takes no other aspirin products.  Patient is being referred for admission for acute stroke.  She still reports that she feels like her limbs are heavy still  Assessment & Plan    Principal Problem: Acute multiple embolic CVA -In the setting of known A. fib, presented with acute vertigo, earlier this morning noted to be more somnolent, with right upper extremity drift, right facial droop and dysarthria. -MRI 12/17 showed left lateral pontine infarct, small vessel disease, old left and right cerebellar infarcts -CTA head and neck no large vessel occlusion, occluded left PCA -2D echo showed EF of 45 to 50% with diffuse hypokinesis no PFO -Neurology consulted, recommended resume home Eliquis and add low-dose aspirin -Repeat MRI on 12/18 showed interval treatment of acute infarction in the left greater than right thalami, left posterior limb  of internal capsule, left cerebral peduncle, left midbrain, left posterior hippocampus and left occipital lobe, no hemorrhage or mass-effect.  Some stable small acute/early subacute infarction in the left lateral pons -LDL 113, continue pravastatin -Hemoglobin A1c 5.8 -PT recommended CIR, consult placed -Per neurology recommendations, continue Eliquis and added baby aspirin for stroke prevention, continue pravastatin. avoid low BP -Mental status improving however still right-sided weakness persists   Residual dysphagia from stroke -SLP following, passed swallow test, started on dysphagia 1 diet   Chronic atrial fibrillation (HCC) with bradycardia -Much improved, continue to hold Coreg and diltiazem.  If RVR develops, will place on metoprolol 25 twice daily.  [place] cardiology commendations.  Cardiology consulted, continue aspirin and Eliquis.   Hyperlipidemia LDL 113, continue pravastatin  Code Status: Full code DVT Prophylaxis: Eliquis Family Communication: Discussed in detail with the patient, all imaging results, lab results explained to the patient's daughter and son at the bedside   Disposition Plan: Continue PT, skilled nursing facility versus CIR, possibly next 24 to 48 hours  Time Spent in minutes 25 minutes  Procedures:  MRI brain, 2D echo  Consultants:   Neurology  Antimicrobials:      Medications  Scheduled Meds: .  stroke: mapping our early stages of recovery book   Does not apply Once  . ALPRAZolam  0.5 mg Oral BID  . apixaban  2.5 mg Oral BID  . aspirin EC  81 mg Oral Daily  . bumetanide  2 mg Oral Daily  . ferrous sulfate  325 mg Oral Q breakfast  . losartan  50 mg Oral BID  . magnesium oxide  400 mg Oral BID  . multivitamin  1 tablet Oral Q1200  . multivitamin with minerals  1 tablet Oral Daily  . potassium chloride  10 mEq Oral BID  . pravastatin  40 mg Oral q1800  . vitamin B-12   Oral Daily   Continuous Infusions: PRN  Meds:.acetaminophen **OR** acetaminophen (TYLENOL) oral liquid 160 mg/5 mL **OR** acetaminophen   Antibiotics   Anti-infectives (From admission, onward)   None        Subjective:   [name] was seen and examined today.  Somewhat tired and exhausted today, per family had a lot of visitors yesterday.  No bradycardia.  Right-sided weakness still persists.  No chest pain, shortness of breath  Objective:   Vitals:   12/17/18 0024 12/17/18 0445 12/17/18 0731 12/17/18 1102  BP: (!) 146/84 138/68 (!) 152/66 (!) 126/50  Pulse: 80 74 67 78  Resp:  18 17 20   Temp: 98.3 F (36.8 C) 98.2 F (36.8 C) 98.5 F (36.9 C) 98 F (36.7 C)  TempSrc: Oral Oral Axillary Axillary  SpO2: 94% 96% 97% 97%   No intake or output data in the 24 hours ending 12/17/18 1315   Wt Readings from Last 3 Encounters:  09/02/18 57.6 kg    Physical Exam  General: Alert and oriented x 2, NAD  Eyes:   HEENT:    Cardiovascular: S1 S2 auscultated, RRR. No pedal edema b/l  Respiratory: Fairly CTA B anteriorly   gastrointestinal: Soft, nontender, nondistended, + bowel sounds  Ext: no pedal edema bilaterally  Neuro: Right-sided weakness persists 1/5  Musculoskeletal: No digital cyanosis, clubbing  Skin: No rashes  Psych: Normal affect and demeanor, alert and oriented x3     Data Reviewed:  I have personally reviewed following labs and imaging studies  Micro Results No results found for this or any previous visit (from the past 240 hour(s)).  Radiology Reports Ct Angio Head W Or Wo Contrast  Result Date: 12/13/2018 CLINICAL DATA:  Unresponsive patient EXAM: CT ANGIOGRAPHY HEAD AND NECK TECHNIQUE: Multidetector CT imaging of the head and neck was performed using the standard protocol during bolus administration of intravenous contrast. Multiplanar CT image reconstructions and MIPs were obtained to evaluate the vascular anatomy. Carotid stenosis measurements (when applicable) are obtained  utilizing NASCET criteria, using the distal internal carotid diameter as the denominator. CONTRAST:  80mL ISOVUE-370 IOPAMIDOL (ISOVUE-370) INJECTION 76% COMPARISON:  Head CT 12/12/2018 FINDINGS: CTA NECK FINDINGS SKELETON: There is no bony spinal canal stenosis. No lytic or blastic lesion. OTHER NECK: Normal pharynx, larynx and major salivary glands. No cervical lymphadenopathy. Unremarkable thyroid gland. UPPER CHEST: No pneumothorax or pleural effusion. No nodules or masses. AORTIC ARCH: There is no calcific atherosclerosis of the aortic arch. There is no aneurysm, dissection or hemodynamically significant stenosis of the visualized ascending aorta and aortic arch. Conventional 3 vessel aortic branching pattern. There is moderate stenosis of the proximal left subclavian artery. RIGHT CAROTID SYSTEM: --  Common carotid artery: Widely patent origin without common carotid artery dissection or aneurysm. --Internal carotid artery: Normal without aneurysm, dissection or stenosis. --External carotid artery: No acute abnormality. LEFT CAROTID SYSTEM: --Common carotid artery: Widely patent origin without common carotid artery dissection or aneurysm. --Internal carotid artery: Normal without aneurysm, dissection or stenosis. --External carotid artery: No acute abnormality. VERTEBRAL ARTERIES: Left dominant configuration. Both origins are normal. No dissection, occlusion or flow-limiting stenosis to the vertebrobasilar confluence. CTA HEAD FINDINGS ANTERIOR CIRCULATION: --Intracranial internal carotid arteries: Normal. --Anterior cerebral arteries: Normal. Both A1 segments are present. Patent anterior communicating artery. --Middle cerebral arteries: Normal. --Posterior communicating arteries: Absent bilaterally. POSTERIOR CIRCULATION: --Basilar artery: Normal. --Posterior cerebral arteries: Normal right PCA. The left PCA is occluded, age indeterminate. --Superior cerebellar arteries: Normal. --Inferior cerebellar arteries:  Normal anterior and posterior inferior cerebellar arteries. VENOUS SINUSES: As permitted by contrast timing, patent. ANATOMIC VARIANTS: None DELAYED PHASE: No parenchymal contrast enhancement. Review of the MIP images confirms the above findings. IMPRESSION: 1. No emergent large vessel occlusion. 2. Occlusion of the left PCA, age indeterminate. 3. Moderate stenosis of the proximal left subclavian artery. Electronically Signed   By: Ulyses Jarred M.D.   On: 12/13/2018 04:34   Ct Head Wo Contrast  Result Date: 12/12/2018 CLINICAL DATA:  Altered mental status. Unresponsive. Recent stroke. EXAM: CT HEAD WITHOUT CONTRAST TECHNIQUE: Contiguous axial images were obtained from the base of the skull through the vertex without intravenous contrast. COMPARISON:  Head CT 12/12/2018 FINDINGS: Brain: Unchanged appearance of old left cerebellar hemisphere infarct. No edema within the pons. No acute hemorrhage. No mass effect. There is generalized atrophy without lobar predilection. There is hypoattenuation of the periventricular white matter, most commonly indicating chronic ischemic microangiopathy. Vascular: No abnormal hyperdensity of the major intracranial arteries or dural venous sinuses. No intracranial atherosclerosis. Skull: The visualized skull base, calvarium and extracranial soft tissues are normal. Sinuses/Orbits: Partial opacification of the sphenoid sinus. The orbits are normal. IMPRESSION: Unchanged examination without hemorrhage or mass effect. No pontine edema. Electronically Signed   By: Ulyses Jarred M.D.   On: 12/12/2018 23:30   Ct Head Wo Contrast  Result Date: 12/12/2018 CLINICAL DATA:  84 year old female with episode of vertigo this morning upon waking. Initial encounter. EXAM: CT HEAD WITHOUT CONTRAST TECHNIQUE: Contiguous axial images were obtained from the base of the skull through the vertex without intravenous contrast. COMPARISON:  None. FINDINGS: Brain: Small acute inferior left cerebellar  (posterior inferior cerebellar artery distribution) infarct. Secondary to streak artifact from calvarium its difficult to evaluate for the possibility of tiny amount of blood. No obvious blood identified. Chronic microvascular changes. Remote small left basal ganglia infarct. No intracranial mass lesion noted on this unenhanced exam. Vascular: Atherosclerotic changes vertebral arteries and carotid arteries. No acute hyperdense vessel. Skull: No acute abnormality. Sinuses/Orbits: Post lens replacement. Moderate mucosal thickening/opacification right sphenoid sinus. Minimal mucosal thickening left sphenoid sinus. Other: Mastoid air cells and middle ear cavities are clear. IMPRESSION: 1. Small acute inferior left cerebellar (posterior inferior cerebellar artery distribution) infarct. Secondary to streak artifact from calvarium its difficult to evaluate for the possibility of tiny amount of blood. No obvious blood identified. 2. Chronic microvascular changes. Remote small left basal ganglia infarct. 3. Moderate mucosal thickening/opacification right sphenoid sinus. Minimal mucosal thickening left sphenoid sinus. These results were called by telephone at the time of interpretation on 12/12/2018 at 5:35 pm to [facility] , who verbally acknowledged these results. Electronically Signed   By: Alcide Evener.D.  On: 12/12/2018 17:40   Ct Angio Neck W Or Wo Contrast  Result Date: 12/13/2018 CLINICAL DATA:  Unresponsive patient EXAM: CT ANGIOGRAPHY HEAD AND NECK TECHNIQUE: Multidetector CT imaging of the head and neck was performed using the standard protocol during bolus administration of intravenous contrast. Multiplanar CT image reconstructions and MIPs were obtained to evaluate the vascular anatomy. Carotid stenosis measurements (when applicable) are obtained utilizing NASCET criteria, using the distal internal carotid diameter as the denominator. CONTRAST:  67mL ISOVUE-370 IOPAMIDOL (ISOVUE-370) INJECTION 76%  COMPARISON:  Head CT 12/12/2018 FINDINGS: CTA NECK FINDINGS SKELETON: There is no bony spinal canal stenosis. No lytic or blastic lesion. OTHER NECK: Normal pharynx, larynx and major salivary glands. No cervical lymphadenopathy. Unremarkable thyroid gland. UPPER CHEST: No pneumothorax or pleural effusion. No nodules or masses. AORTIC ARCH: There is no calcific atherosclerosis of the aortic arch. There is no aneurysm, dissection or hemodynamically significant stenosis of the visualized ascending aorta and aortic arch. Conventional 3 vessel aortic branching pattern. There is moderate stenosis of the proximal left subclavian artery. RIGHT CAROTID SYSTEM: --Common carotid artery: Widely patent origin without common carotid artery dissection or aneurysm. --Internal carotid artery: Normal without aneurysm, dissection or stenosis. --External carotid artery: No acute abnormality. LEFT CAROTID SYSTEM: --Common carotid artery: Widely patent origin without common carotid artery dissection or aneurysm. --Internal carotid artery: Normal without aneurysm, dissection or stenosis. --External carotid artery: No acute abnormality. VERTEBRAL ARTERIES: Left dominant configuration. Both origins are normal. No dissection, occlusion or flow-limiting stenosis to the vertebrobasilar confluence. CTA HEAD FINDINGS ANTERIOR CIRCULATION: --Intracranial internal carotid arteries: Normal. --Anterior cerebral arteries: Normal. Both A1 segments are present. Patent anterior communicating artery. --Middle cerebral arteries: Normal. --Posterior communicating arteries: Absent bilaterally. POSTERIOR CIRCULATION: --Basilar artery: Normal. --Posterior cerebral arteries: Normal right PCA. The left PCA is occluded, age indeterminate. --Superior cerebellar arteries: Normal. --Inferior cerebellar arteries: Normal anterior and posterior inferior cerebellar arteries. VENOUS SINUSES: As permitted by contrast timing, patent. ANATOMIC VARIANTS: None DELAYED PHASE:  No parenchymal contrast enhancement. Review of the MIP images confirms the above findings. IMPRESSION: 1. No emergent large vessel occlusion. 2. Occlusion of the left PCA, age indeterminate. 3. Moderate stenosis of the proximal left subclavian artery. Electronically Signed   By: Ulyses Jarred M.D.   On: 12/13/2018 04:34   Mr Brain Wo Contrast  Result Date: 12/13/2018 CLINICAL DATA:  84 y/o  F; stroke follow-up. EXAM: MRI HEAD WITHOUT CONTRAST TECHNIQUE: Axial DWI, coronal DWI, axial T2 FLAIR, axial SWI sequences were acquired. COMPARISON:  12/13/2018 CTA of the head. 12/12/2018 MRI of the head. FINDINGS: Brain: Interval development of patchy foci of reduced diffusion compatible with interval acute infarction in the left thalamus, left posterior limb of internal capsule, left cerebral peduncle, left posterior hippocampus, and the left occipital lobe. Additional punctate foci of reduced diffusion are present within the left paramedian midbrain and right thalamus. Stable small focus of reduced diffusion within the left lateral pons. Chronic hemosiderin stained infarction within the left inferomedial cerebellar hemisphere, microvascular ischemic changes of the brain, and volume loss of the brain are stable on the T2 FLAIR weighted sequence in comparison with the prior MRI of the head. Stable small focus of chronic microhemorrhage within the right temporal periventricular white matter. No mass effect, hydrocephalus, or herniation. Vascular: Stable. Skull and upper cervical spine: Negative. Sinuses/Orbits: Sphenoid sinus mucosal thickening. No additional visible abnormal signal within paranasal sinuses or the mastoid air cells. Orbits are unremarkable. Other: None. IMPRESSION: 1. Interval development of acute infarctions within  the left-greater-than-right thalami, left posterior limb of internal capsule, left cerebral peduncle, left midbrain, left posterior hippocampus, and left occipital lobe. No hemorrhage or mass  effect. 2. Stable small acute/early subacute infarction within the left lateral pons. 3. Stable background of chronic microvascular ischemic changes, volume loss of the brain, and chronic infarction of the left inferior cerebellum. These results will be called to the ordering clinician or representative by the Radiologist Assistant, and communication documented in the PACS or zVision Dashboard. Electronically Signed   By: Kristine Garbe M.D.   On: 12/13/2018 14:58   Mr Brain Wo Contrast  Result Date: 12/12/2018 CLINICAL DATA:  84 y/o F; episodes of vertigo. Evaluation for stroke. EXAM: MRI HEAD WITHOUT CONTRAST TECHNIQUE: Multiplanar, multiecho pulse sequences of the brain and surrounding structures were obtained without intravenous contrast. COMPARISON:  12/12/2018 CT head. FINDINGS: Brain: 5 mm focus of reduced diffusion within the left lateral pons compatible with acute/early subacute infarction (series 5, image 61 and series 7, image 48). No associated hemorrhage or mass effect. Chronic hemosiderin stained infarction of the left inferomedial cerebellar hemisphere with laminar necrosis. Very small chronic infarction of the right inferior cerebellar hemisphere. Several nonspecific T2 FLAIR hyperintensities in subcortical and periventricular white matter are compatible with moderate chronic microvascular ischemic changes for age. Moderate volume loss of the brain. Additional punctate focus of susceptibility hypointensity is present within the right temporal periventricular white matter compatible with hemosiderin deposition of chronic hemorrhage. No acute extra-axial collection, hydrocephalus, mass effect, or herniation. Vascular: Normal flow voids. Skull and upper cervical spine: Normal marrow signal. Sinuses/Orbits: Moderate mucosal thickening of the right sphenoid sinus. No abnormal signal of the additional visible paranasal sinuses and the mastoid air cells. Bilateral intra-ocular lens  replacement. Other: None. IMPRESSION: 1. 5 mm acute/early subacute infarction within the left lateral pons. No associated hemorrhage or mass effect. 2. Small chronic infarct in the left inferior medial cerebellar hemisphere and very small chronic infarction in the right inferior cerebellar hemisphere. 3. Moderate chronic microvascular ischemic changes and volume loss of the brain. 4. Sphenoid sinus disease. These results will be called to the ordering clinician or representative by the Radiologist Assistant, and communication documented in the PACS or zVision Dashboard. Electronically Signed   By: Kristine Garbe M.D.   On: 12/12/2018 21:06   Dg Swallowing Func-speech Pathology  Result Date: 12/16/2018 Objective Swallowing Evaluation: Type of Study: Bedside Swallow Evaluation  Patient Details Name: Zykeria Laguardia MRN: [id number] Date of Birth: 1932/02/19 Today's Date: 12/16/2018 Time: SLP Start Time (ACUTE ONLY): 1015 -SLP Stop Time (ACUTE ONLY): 1040 SLP Time Calculation (min) (ACUTE ONLY): 25 min Past Medical History: Past Medical History: Diagnosis Date . Atrial fibrillation [facility])  Past Surgical History: Past Surgical History: Procedure Laterality Date . CARDIAC CATHETERIZATION   HPI: Pritika Alvarez is a 84 y.o. female past medical history of atrial fibrillation on Eliquis, BPPV in the past, anxiety, and history of vertigo. Presented to hospital with unsteady gait, no slurring of speech or swallowing problems. CT suggestive of cerebellar stroke. Patient passed the [facility]. Subsequently developed neuro changes; MRI was repeated 12/18, which revealed: Interval development of acute infarctions within the left-greater-than-right thalami, left posterior limb of internal capsule, left cerebral peduncle, left midbrain, left posterior hippocampus, and left occipital lobe. No hemorrhage or mass effect. 2. Stable small acute/early subacute infarction within the left lateral pons. 3. Stable background  of chronic microvascular ischemic changes, volume loss of the brain, and chronic infarction of the left inferior  cerebellum.   Subjective: arrives with daughter, son to fluoro Assessment / Plan / Recommendation CHL IP CLINICAL IMPRESSIONS 12/16/2018 Clinical Impression Pt presents with primary oral dysphagia, with premature spillage of liquids resulting in penetration of thin, nectar thick liquids before the swallow. Swallow initiation more timely with honey-thick liquids, usually at the valleculae, with good airway protection. Prolonged/weak mastication and decreased bolus cohesion with solids, and there is moderate lingual residue and in the right lateral sulcus. Lingual residue cleared with cued swallow and sip of honey-thick liquid; SLP cleared sulcus with finger sweep. Mild lingual weakness, with intermittent mild residue in the valleculae and pyriforms, which clears with second swallow (sometimes volitional). Recommend she continue with dys 2, honey-thick liquids, meds crushed in puree, with good prognosis for advancement with improvements in oral control. Family present and educated re: findings, including use of pillows which were need for positioning pt upright/head forward posture. Recommend teaspoon trials of thinner consistencies with SLP. SLP Visit Diagnosis Dysphagia, oral phase (R13.11) Attention and concentration deficit following -- Frontal lobe and executive function deficit following -- Impact on safety and function --   CHL IP TREATMENT RECOMMENDATION 12/16/2018 Treatment Recommendations Therapy as outlined in treatment plan below;F/U MBS in --- days (Comment)   Prognosis 12/16/2018 Prognosis for Safe Diet Advancement Good Barriers to Reach Goals Cognitive deficits Barriers/Prognosis Comment -- CHL IP DIET RECOMMENDATION 12/16/2018 SLP Diet Recommendations Dysphagia 2 (Fine chop) solids;Honey thick liquids Liquid Administration via Cup Medication Administration Crushed with puree Compensations  Slow rate;Small sips/bites;Minimize environmental distractions;Follow solids with liquid Postural Changes --   CHL IP OTHER RECOMMENDATIONS 12/16/2018 Recommended Consults -- Oral Care Recommendations Oral care BID Other Recommendations Order thickener from pharmacy   CHL IP FOLLOW UP RECOMMENDATIONS 12/16/2018 Follow up Recommendations Inpatient Rehab   CHL IP FREQUENCY AND DURATION 12/16/2018 Speech Therapy Frequency (ACUTE ONLY) min 2x/week Treatment Duration 1 week      CHL IP ORAL PHASE 12/16/2018 Oral Phase Impaired Oral - Pudding Teaspoon -- Oral - Pudding Cup -- Oral - Honey Teaspoon Decreased bolus cohesion;Premature spillage;Reduced posterior propulsion Oral - Honey Cup Decreased bolus cohesion;Reduced posterior propulsion Oral - Nectar Teaspoon Decreased bolus cohesion;Premature spillage;Reduced posterior propulsion Oral - Nectar Cup Reduced posterior propulsion;Decreased bolus cohesion;Premature spillage Oral - Nectar Straw Reduced posterior propulsion;Decreased bolus cohesion;Premature spillage Oral - Thin Teaspoon Reduced posterior propulsion;Decreased bolus cohesion;Premature spillage Oral - Thin Cup -- Oral - Thin Straw -- Oral - Puree Decreased bolus cohesion;Reduced posterior propulsion Oral - Mech Soft -- Oral - Regular Impaired mastication;Weak lingual manipulation;Reduced posterior propulsion;Right pocketing in lateral sulci;Lingual/palatal residue;Decreased bolus cohesion Oral - Multi-Consistency -- Oral - Pill -- Oral Phase - Comment --  CHL IP PHARYNGEAL PHASE 12/16/2018 Pharyngeal Phase Impaired Pharyngeal- Pudding Teaspoon -- Pharyngeal -- Pharyngeal- Pudding Cup -- Pharyngeal -- Pharyngeal- Honey Teaspoon -- Pharyngeal -- Pharyngeal- Honey Cup Delayed swallow initiation-vallecula;Pharyngeal residue - valleculae;Pharyngeal residue - pyriform;Reduced tongue base retraction Pharyngeal -- Pharyngeal- Nectar Teaspoon -- Pharyngeal -- Pharyngeal- Nectar Cup -- Pharyngeal -- Pharyngeal- Nectar  Straw Delayed swallow initiation-pyriform sinuses;Reduced tongue base retraction;Penetration/Aspiration before swallow;Pharyngeal residue - valleculae;Pharyngeal residue - pyriform Pharyngeal Material enters airway, remains ABOVE vocal cords then ejected out;Material enters airway, remains ABOVE vocal cords and not ejected out Pharyngeal- Thin Teaspoon -- Pharyngeal -- Pharyngeal- Thin Cup -- Pharyngeal -- Pharyngeal- Thin Straw -- Pharyngeal -- Pharyngeal- Puree Delayed swallow initiation-vallecula;Reduced tongue base retraction;Pharyngeal residue - valleculae Pharyngeal -- Pharyngeal- Mechanical Soft -- Pharyngeal -- Pharyngeal- Regular Delayed swallow initiation-vallecula;Reduced tongue base retraction;Pharyngeal residue - valleculae  Pharyngeal -- Pharyngeal- Multi-consistency -- Pharyngeal -- Pharyngeal- Pill -- Pharyngeal -- Pharyngeal Comment --  [facility] IP CERVICAL ESOPHAGEAL PHASE 12/16/2018 Cervical Esophageal Phase WFL Pudding Teaspoon -- Pudding Cup -- Honey Teaspoon -- Honey Cup -- Nectar Teaspoon -- Nectar Cup -- Nectar Straw -- Thin Teaspoon -- Thin Cup -- Thin Straw -- Puree -- Mechanical Soft -- Regular -- Multi-consistency -- Pill -- Cervical Esophageal Comment -- Deneise Lever, MS, CCC-SLP Speech-Language Pathologist [facility] Pager: [phone] Office: [phone] Aliene Altes 12/16/2018, 1:31 PM               Lab Data:  CBC: Recent Labs  Lab 12/12/18 1346 12/17/18 0629  WBC 6.1 6.9  HGB 12.0 13.7  HCT 38.0 43.1  MCV 92.0 92.1  PLT 178 706   Basic Metabolic Panel: Recent Labs  Lab 12/12/18 1346 12/17/18 0629  NA 139 145  K 3.9 3.9  CL 103 108  CO2 23 27  GLUCOSE 211* 133*  BUN 18 24*  CREATININE 1.02* 0.89  CALCIUM 9.3 9.2   GFR: CrCl cannot be calculated (Unknown ideal weight.). Liver Function Tests: No results for input(s): AST, ALT, ALKPHOS, BILITOT, PROT, ALBUMIN in the last 168 hours. No results for input(s): LIPASE, AMYLASE in the last  168 hours. No results for input(s): AMMONIA in the last 168 hours. Coagulation Profile: No results for input(s): INR, PROTIME in the last 168 hours. Cardiac Enzymes: No results for input(s): CKTOTAL, CKMB, CKMBINDEX, TROPONINI in the last 168 hours. BNP (last 3 results) No results for input(s): PROBNP in the last 8760 hours. HbA1C: No results for input(s): HGBA1C in the last 72 hours. CBG: Recent Labs  Lab 12/12/18 1701  GLUCAP 100*   Lipid Profile: No results for input(s): CHOL, HDL, LDLCALC, TRIG, CHOLHDL, LDLDIRECT in the last 72 hours. Thyroid Function Tests: No results for input(s): TSH, T4TOTAL, FREET4, T3FREE, THYROIDAB in the last 72 hours. Anemia Panel: No results for input(s): VITAMINB12, FOLATE, FERRITIN, TIBC, IRON, RETICCTPCT in the last 72 hours. Urine analysis:    Component Value Date/Time   COLORURINE STRAW (A) 12/12/2018 1344   APPEARANCEUR CLEAR 12/12/2018 1344   LABSPEC 1.003 (L) 12/12/2018 1344   PHURINE 7.0 12/12/2018 1344   GLUCOSEU 50 (A) 12/12/2018 1344   HGBUR NEGATIVE 12/12/2018 1344   BILIRUBINUR NEGATIVE 12/12/2018 1344   KETONESUR NEGATIVE 12/12/2018 1344   PROTEINUR NEGATIVE 12/12/2018 1344   NITRITE NEGATIVE 12/12/2018 1344   LEUKOCYTESUR NEGATIVE 12/12/2018 1344     Kelii Chittum M.D. [facility] 12/17/2018, 1:15 PM  Pager: [phone] Between 7am to 7pm - call Pager - [phone]  After 7pm go to [email or web address] - password [email or web address]  Call night coverage person covering after 7pm

## 2018-12-18 DIAGNOSIS — G9340 Encephalopathy, unspecified: Secondary | ICD-10-CM | POA: Diagnosis not present

## 2018-12-18 DIAGNOSIS — R278 Other lack of coordination: Secondary | ICD-10-CM | POA: Diagnosis not present

## 2018-12-18 DIAGNOSIS — I110 Hypertensive heart disease with heart failure: Secondary | ICD-10-CM | POA: Diagnosis not present

## 2018-12-18 DIAGNOSIS — I69351 Hemiplegia and hemiparesis following cerebral infarction affecting right dominant side: Secondary | ICD-10-CM | POA: Diagnosis not present

## 2018-12-18 DIAGNOSIS — I69398 Other sequelae of cerebral infarction: Secondary | ICD-10-CM | POA: Diagnosis not present

## 2018-12-18 DIAGNOSIS — M6281 Muscle weakness (generalized): Secondary | ICD-10-CM | POA: Diagnosis not present

## 2018-12-18 DIAGNOSIS — G894 Chronic pain syndrome: Secondary | ICD-10-CM | POA: Diagnosis not present

## 2018-12-18 DIAGNOSIS — I482 Chronic atrial fibrillation, unspecified: Secondary | ICD-10-CM | POA: Diagnosis not present

## 2018-12-18 DIAGNOSIS — I6300 Cerebral infarction due to thrombosis of unspecified precerebral artery: Secondary | ICD-10-CM | POA: Diagnosis not present

## 2018-12-18 DIAGNOSIS — I5022 Chronic systolic (congestive) heart failure: Secondary | ICD-10-CM | POA: Diagnosis not present

## 2018-12-18 DIAGNOSIS — G459 Transient cerebral ischemic attack, unspecified: Secondary | ICD-10-CM | POA: Diagnosis not present

## 2018-12-18 DIAGNOSIS — N179 Acute kidney failure, unspecified: Secondary | ICD-10-CM | POA: Diagnosis not present

## 2018-12-18 DIAGNOSIS — F0150 Vascular dementia without behavioral disturbance: Secondary | ICD-10-CM | POA: Diagnosis not present

## 2018-12-18 DIAGNOSIS — I509 Heart failure, unspecified: Secondary | ICD-10-CM | POA: Diagnosis not present

## 2018-12-18 DIAGNOSIS — I69321 Dysphasia following cerebral infarction: Secondary | ICD-10-CM | POA: Diagnosis not present

## 2018-12-18 DIAGNOSIS — I69391 Dysphagia following cerebral infarction: Secondary | ICD-10-CM | POA: Diagnosis not present

## 2018-12-18 DIAGNOSIS — I10 Essential (primary) hypertension: Secondary | ICD-10-CM | POA: Diagnosis not present

## 2018-12-18 DIAGNOSIS — Z7982 Long term (current) use of aspirin: Secondary | ICD-10-CM | POA: Diagnosis not present

## 2018-12-18 DIAGNOSIS — E785 Hyperlipidemia, unspecified: Secondary | ICD-10-CM | POA: Diagnosis not present

## 2018-12-18 DIAGNOSIS — I4819 Other persistent atrial fibrillation: Secondary | ICD-10-CM | POA: Diagnosis not present

## 2018-12-18 DIAGNOSIS — E1159 Type 2 diabetes mellitus with other circulatory complications: Secondary | ICD-10-CM | POA: Diagnosis not present

## 2018-12-18 DIAGNOSIS — M2550 Pain in unspecified joint: Secondary | ICD-10-CM | POA: Diagnosis not present

## 2018-12-18 DIAGNOSIS — E119 Type 2 diabetes mellitus without complications: Secondary | ICD-10-CM | POA: Diagnosis not present

## 2018-12-18 DIAGNOSIS — R4189 Other symptoms and signs involving cognitive functions and awareness: Secondary | ICD-10-CM | POA: Diagnosis not present

## 2018-12-18 DIAGNOSIS — G8191 Hemiplegia, unspecified affecting right dominant side: Secondary | ICD-10-CM | POA: Diagnosis not present

## 2018-12-18 DIAGNOSIS — F39 Unspecified mood [affective] disorder: Secondary | ICD-10-CM | POA: Diagnosis not present

## 2018-12-18 DIAGNOSIS — Z7901 Long term (current) use of anticoagulants: Secondary | ICD-10-CM | POA: Diagnosis not present

## 2018-12-18 DIAGNOSIS — Z741 Need for assistance with personal care: Secondary | ICD-10-CM | POA: Diagnosis not present

## 2018-12-18 DIAGNOSIS — Z7401 Bed confinement status: Secondary | ICD-10-CM | POA: Diagnosis not present

## 2018-12-18 MED ORDER — ASPIRIN 81 MG PO TBEC: 81.0000 mg | DELAYED_RELEASE_TABLET | Freq: Every day | ORAL | Status: AC

## 2018-12-18 MED ORDER — ALPRAZOLAM 0.5 MG PO TABS: 0.5000 mg | ORAL_TABLET | Freq: Two times a day (BID) | ORAL | 0 refills | Status: AC

## 2018-12-18 MED ORDER — PRAVASTATIN SODIUM 40 MG PO TABS: 40.0000 mg | ORAL_TABLET | Freq: Every day | ORAL | Status: AC

## 2018-12-18 NOTE — Progress Notes (Signed)
Patient is set to discharge to [facility] today.  Left voicemail for patients daughter, Lynelle Smoke. Discharge packet given to RN. [facility] scheduled for transportation at [place], [place] Worker [phone]

## 2018-12-18 NOTE — Discharge Summary (Signed)
Physician Discharge Summary   Patient ID: Kayla Contreras MRN: [id number] DOB/AGE: [birth year] 84 y.o.  Admit date: 12/12/2018 Discharge date: 12/18/2018  Primary Care Physician:  Haywood Pao, MD   Recommendations for Outpatient Follow-up:  1. Follow up with PCP in 1-2 weeks 2. Continue Eliquis, added aspirin 81 mg daily 3. Currently Cardizem and beta-blocker on hold due to bradycardia with sinus pauses during hospitalization.  If patient starts to have RVR, please restart heart rate 25 mg twice a day 4. Patient will need SLP evaluation to upgrade diet 5. 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 Other Other (See Comments)    Steroids Head hurts and gives her the shakes     DISCHARGE MEDICATIONS: Allergies as of 12/18/2018      Reactions   Other Other (See Comments)   Steroids Head hurts and gives her the shakes      Medication List    STOP taking these medications   carvedilol 25 MG tablet Commonly known as:  COREG   DILT-XR 120 MG 24 hr capsule Generic drug:  diltiazem     TAKE these medications   ALPRAZolam 0.5 MG tablet Commonly known as:  XANAX Take 1 tablet (0.5 mg total) by mouth 2 (two) times daily.   apixaban 2.5 MG Tabs tablet Commonly known as:  ELIQUIS Take 2.5 mg by mouth 2 (two) times daily.   aspirin 81 MG EC tablet Take 1 tablet (81 mg total) by mouth daily. Start taking on:  December 19, 2018    bumetanide 2 MG tablet Commonly known as:  BUMEX Take 2 mg by mouth daily.   CENTRUM SILVER 50+WOMEN Tabs Take 1 tablet by mouth daily.   PRESERVISION AREDS 2+MULTI VIT Caps Take 1 tablet by mouth daily at 12 noon.   esomeprazole 40 MG capsule Commonly known as:  NEXIUM Take 40 mg by mouth daily at 12 noon.   ferrous sulfate 325 (65 FE) MG tablet Take 325 mg by mouth daily with breakfast.   losartan 50 MG tablet Commonly known as:  COZAAR Take 50 mg by mouth 2 (two) times daily.   magnesium oxide 400 MG tablet Commonly known as:  MAG-OX Take 400 mg by mouth 2 (two) times daily.   metFORMIN 500 MG tablet Commonly known as:  GLUCOPHAGE Take 1,000 mg by mouth 2 (two) times daily.   ondansetron 4 MG disintegrating tablet Commonly known as:  ZOFRAN ODT Take 1 tablet (4 mg total) by mouth every 8 (eight) hours as needed for nausea or vomiting.   potassium chloride 8 MEQ tablet Commonly known as:  KLOR-CON Take 8 mEq by mouth 2 (two) times daily.   pravastatin 40 MG tablet Commonly known as:  PRAVACHOL Take 1 tablet (40 mg total) by mouth at bedtime.   Vitamin B-12 5000 MCG Subl Place 5,000 Units under the tongue daily.        Brief H and P: For complete details please refer to admission H and P, but in brief *Kayla Cherryis a 84 y.o.femalewith medical history  significant ofA. fib on Eliquis comes in with unsteady gait since 8:00 this morning. Patient has vertigo and takes meclizine at home. But this felt much different than her routine vertigo she denies any fevers. She did have a sinus cold recently. She denies any weakness in any of her extremities. No numbness or tingling. No slurred speech or drooling. Patient is being referred for admission for acute infarct shown on CT scan. She is compliant with her Eliquis she takes no other aspirin products. Patient was admitted for further work-up  Hospital Course:  Acute multiple embolic CVA -In the setting  of known chronic A. fib, presented with acute vertigo, earlier this morning noted to be more somnolent, with right upper extremity drift, right facial droop and dysarthria. -MRI 12/17 showed left lateral pontine infarct, small vessel disease, old left and right cerebellar infarcts -CTA head and neck no large vessel occlusion, occluded left PCA -2D echo showed EF of 45 to 50% with diffuse hypokinesis no PFO -Neurology was consulted, recommended resume home Eliquis and add low-dose aspirin -Repeat MRI on 12/18 showed interval development f acute infarction in the left greater than right thalami, left posterior limb of internal capsule, left cerebral peduncle, left midbrain, left posterior hippocampus and left occipital lobe, no hemorrhage or mass-effect.  Stable small acute/early subacute infarction in the left lateral pons -LDL 113, continue pravastatin -Hemoglobin A1c 5.8 -PT had recommended rehab, consult was also placed to CIR -Per neurology recommendations, continue Eliquis and added baby aspirin for stroke prevention, continue pravastatin. avoid low BP -Mental status is improving however patient's right-sided weakness is still persisting   Residual dysphagia from stroke -Patient was followed closely by speech therapy.  She passed a swallow testing, diet has been upgraded to dysphagia 2 diet honey thick liquids.   She will need to continue follow-up with swallow evaluation.   Chronic atrial fibrillation ([place]) with bradycardia -12/19, patient was noticed to have bradycardia with heart rate in 30s and sinus pauses.  Coreg and Cardizem were held.   -Cardiology was consulted, Dr. Virgina Jock recommended to continue hold both beta-blocker and Cardizem.  If RVR develops, then place on metoprolol 25 twice daily.    - continue aspirin and Eliquis.   Hyperlipidemia LDL 113, continue pravastatin    Day of Discharge S: No acute complaints, tolerating diet dysphagia 2.  Daughter at the  bedside.  BP (!) 150/104 (BP Location: Right Arm)   Pulse 76   Temp 98 F (36.7 C) (Oral)   Resp 18   SpO2 98%   Physical Exam: General: Alert and awake oriented x3 NAD HEENT: PERRLA CVS: S1-S2 clear, RRR, no pedal edema Chest: clear to auscultation bilaterally, no wheezing rales or rhonchi Abdomen: soft nontender, nondistended, normal bowel sounds Extremities: No cyanosis clubbing or edema Neuro: Right-sided weakness persisting 1/5   The results of significant diagnostics from this hospitalization (including imaging, microbiology, ancillary and laboratory) are listed below for reference.      Procedures/Studies:  2D echo Study Conclusions  - Left ventricle: Systolic function was mildly reduced. The   estimated ejection fraction was in the range of 45% to 50%.   Diffuse hypokinesis. - Aortic valve: Sclerosis without stenosis. There was mild   regurgitation. Valve area (Vmax): 1.34 cm^2. - Mitral valve: There was mild regurgitation. - Left atrium: The atrium was mildly dilated. - Right atrium: The atrium was mildly dilated. - Atrial septum: No defect or patent foramen ovale was identified.  Ct Angio Head W Or Wo  Contrast  Result Date: 12/13/2018 CLINICAL DATA:  Unresponsive patient EXAM: CT ANGIOGRAPHY HEAD AND NECK TECHNIQUE: Multidetector CT imaging of the head and neck was performed using the standard protocol during bolus administration of intravenous contrast. Multiplanar CT image reconstructions and MIPs were obtained to evaluate the vascular anatomy. Carotid stenosis measurements (when applicable) are obtained utilizing NASCET criteria, using the distal internal carotid diameter as the denominator. CONTRAST:  33mL ISOVUE-370 IOPAMIDOL (ISOVUE-370) INJECTION 76% COMPARISON:  Head CT 12/12/2018 FINDINGS: CTA NECK FINDINGS SKELETON: There is no bony spinal canal stenosis. No lytic or blastic lesion. OTHER NECK: Normal pharynx, larynx and major salivary glands. No  cervical lymphadenopathy. Unremarkable thyroid gland. UPPER CHEST: No pneumothorax or pleural effusion. No nodules or masses. AORTIC ARCH: There is no calcific atherosclerosis of the aortic arch. There is no aneurysm, dissection or hemodynamically significant stenosis of the visualized ascending aorta and aortic arch. Conventional 3 vessel aortic branching pattern. There is moderate stenosis of the proximal left subclavian artery. RIGHT CAROTID SYSTEM: --Common carotid artery: Widely patent origin without common carotid artery dissection or aneurysm. --Internal carotid artery: Normal without aneurysm, dissection or stenosis. --External carotid artery: No acute abnormality. LEFT CAROTID SYSTEM: --Common carotid artery: Widely patent origin without common carotid artery dissection or aneurysm. --Internal carotid artery: Normal without aneurysm, dissection or stenosis. --External carotid artery: No acute abnormality. VERTEBRAL ARTERIES: Left dominant configuration. Both origins are normal. No dissection, occlusion or flow-limiting stenosis to the vertebrobasilar confluence. CTA HEAD FINDINGS ANTERIOR CIRCULATION: --Intracranial internal carotid arteries: Normal. --Anterior cerebral arteries: Normal. Both A1 segments are present. Patent anterior communicating artery. --Middle cerebral arteries: Normal. --Posterior communicating arteries: Absent bilaterally. POSTERIOR CIRCULATION: --Basilar artery: Normal. --Posterior cerebral arteries: Normal right PCA. The left PCA is occluded, age indeterminate. --Superior cerebellar arteries: Normal. --Inferior cerebellar arteries: Normal anterior and posterior inferior cerebellar arteries. VENOUS SINUSES: As permitted by contrast timing, patent. ANATOMIC VARIANTS: None DELAYED PHASE: No parenchymal contrast enhancement. Review of the MIP images confirms the above findings. IMPRESSION: 1. No emergent large vessel occlusion. 2. Occlusion of the left PCA, age indeterminate. 3. Moderate  stenosis of the proximal left subclavian artery. Electronically Signed   By: Ulyses Jarred M.D.   On: 12/13/2018 04:34   Ct Head Wo Contrast  Result Date: 12/12/2018 CLINICAL DATA:  Altered mental status. Unresponsive. Recent stroke. EXAM: CT HEAD WITHOUT CONTRAST TECHNIQUE: Contiguous axial images were obtained from the base of the skull through the vertex without intravenous contrast. COMPARISON:  Head CT 12/12/2018 FINDINGS: Brain: Unchanged appearance of old left cerebellar hemisphere infarct. No edema within the pons. No acute hemorrhage. No mass effect. There is generalized atrophy without lobar predilection. There is hypoattenuation of the periventricular white matter, most commonly indicating chronic ischemic microangiopathy. Vascular: No abnormal hyperdensity of the major intracranial arteries or dural venous sinuses. No intracranial atherosclerosis. Skull: The visualized skull base, calvarium and extracranial soft tissues are normal. Sinuses/Orbits: Partial opacification of the sphenoid sinus. The orbits are normal. IMPRESSION: Unchanged examination without hemorrhage or mass effect. No pontine edema. Electronically Signed   By: Ulyses Jarred M.D.   On: 12/12/2018 23:30   Ct Head Wo Contrast  Result Date: 12/12/2018 CLINICAL DATA:  84 year old female with episode of vertigo this morning upon waking. Initial encounter. EXAM: CT HEAD WITHOUT CONTRAST TECHNIQUE: Contiguous axial images were obtained from the base of the skull through the vertex without intravenous contrast. COMPARISON:  None. FINDINGS: Brain: Small acute inferior left cerebellar (posterior inferior cerebellar artery distribution) infarct. Secondary to streak  artifact from calvarium its difficult to evaluate for the possibility of tiny amount of blood. No obvious blood identified. Chronic microvascular changes. Remote small left basal ganglia infarct. No intracranial mass lesion noted on this unenhanced exam. Vascular:  Atherosclerotic changes vertebral arteries and carotid arteries. No acute hyperdense vessel. Skull: No acute abnormality. Sinuses/Orbits: Post lens replacement. Moderate mucosal thickening/opacification right sphenoid sinus. Minimal mucosal thickening left sphenoid sinus. Other: Mastoid air cells and middle ear cavities are clear. IMPRESSION: 1. Small acute inferior left cerebellar (posterior inferior cerebellar artery distribution) infarct. Secondary to streak artifact from calvarium its difficult to evaluate for the possibility of tiny amount of blood. No obvious blood identified. 2. Chronic microvascular changes. Remote small left basal ganglia infarct. 3. Moderate mucosal thickening/opacification right sphenoid sinus. Minimal mucosal thickening left sphenoid sinus. These results were called by telephone at the time of interpretation on 12/12/2018 at 5:35 pm to [facility] , who verbally acknowledged these results. Electronically Signed   By: Genia Del M.D.   On: 12/12/2018 17:40   Ct Angio Neck W Or Wo Contrast  Result Date: 12/13/2018 CLINICAL DATA:  Unresponsive patient EXAM: CT ANGIOGRAPHY HEAD AND NECK TECHNIQUE: Multidetector CT imaging of the head and neck was performed using the standard protocol during bolus administration of intravenous contrast. Multiplanar CT image reconstructions and MIPs were obtained to evaluate the vascular anatomy. Carotid stenosis measurements (when applicable) are obtained utilizing NASCET criteria, using the distal internal carotid diameter as the denominator. CONTRAST:  80mL ISOVUE-370 IOPAMIDOL (ISOVUE-370) INJECTION 76% COMPARISON:  Head CT 12/12/2018 FINDINGS: CTA NECK FINDINGS SKELETON: There is no bony spinal canal stenosis. No lytic or blastic lesion. OTHER NECK: Normal pharynx, larynx and major salivary glands. No cervical lymphadenopathy. Unremarkable thyroid gland. UPPER CHEST: No pneumothorax or pleural effusion. No nodules or masses. AORTIC ARCH: There  is no calcific atherosclerosis of the aortic arch. There is no aneurysm, dissection or hemodynamically significant stenosis of the visualized ascending aorta and aortic arch. Conventional 3 vessel aortic branching pattern. There is moderate stenosis of the proximal left subclavian artery. RIGHT CAROTID SYSTEM: --Common carotid artery: Widely patent origin without common carotid artery dissection or aneurysm. --Internal carotid artery: Normal without aneurysm, dissection or stenosis. --External carotid artery: No acute abnormality. LEFT CAROTID SYSTEM: --Common carotid artery: Widely patent origin without common carotid artery dissection or aneurysm. --Internal carotid artery: Normal without aneurysm, dissection or stenosis. --External carotid artery: No acute abnormality. VERTEBRAL ARTERIES: Left dominant configuration. Both origins are normal. No dissection, occlusion or flow-limiting stenosis to the vertebrobasilar confluence. CTA HEAD FINDINGS ANTERIOR CIRCULATION: --Intracranial internal carotid arteries: Normal. --Anterior cerebral arteries: Normal. Both A1 segments are present. Patent anterior communicating artery. --Middle cerebral arteries: Normal. --Posterior communicating arteries: Absent bilaterally. POSTERIOR CIRCULATION: --Basilar artery: Normal. --Posterior cerebral arteries: Normal right PCA. The left PCA is occluded, age indeterminate. --Superior cerebellar arteries: Normal. --Inferior cerebellar arteries: Normal anterior and posterior inferior cerebellar arteries. VENOUS SINUSES: As permitted by contrast timing, patent. ANATOMIC VARIANTS: None DELAYED PHASE: No parenchymal contrast enhancement. Review of the MIP images confirms the above findings. IMPRESSION: 1. No emergent large vessel occlusion. 2. Occlusion of the left PCA, age indeterminate. 3. Moderate stenosis of the proximal left subclavian artery. Electronically Signed   By: Ulyses Jarred M.D.   On: 12/13/2018 04:34   Mr Brain Wo  Contrast  Result Date: 12/13/2018 CLINICAL DATA:  84 y/o  F; stroke follow-up. EXAM: MRI HEAD WITHOUT CONTRAST TECHNIQUE: Axial DWI, coronal DWI, axial T2 FLAIR, axial SWI  sequences were acquired. COMPARISON:  12/13/2018 CTA of the head. 12/12/2018 MRI of the head. FINDINGS: Brain: Interval development of patchy foci of reduced diffusion compatible with interval acute infarction in the left thalamus, left posterior limb of internal capsule, left cerebral peduncle, left posterior hippocampus, and the left occipital lobe. Additional punctate foci of reduced diffusion are present within the left paramedian midbrain and right thalamus. Stable small focus of reduced diffusion within the left lateral pons. Chronic hemosiderin stained infarction within the left inferomedial cerebellar hemisphere, microvascular ischemic changes of the brain, and volume loss of the brain are stable on the T2 FLAIR weighted sequence in comparison with the prior MRI of the head. Stable small focus of chronic microhemorrhage within the right temporal periventricular white matter. No mass effect, hydrocephalus, or herniation. Vascular: Stable. Skull and upper cervical spine: Negative. Sinuses/Orbits: Sphenoid sinus mucosal thickening. No additional visible abnormal signal within paranasal sinuses or the mastoid air cells. Orbits are unremarkable. Other: None. IMPRESSION: 1. Interval development of acute infarctions within the left-greater-than-right thalami, left posterior limb of internal capsule, left cerebral peduncle, left midbrain, left posterior hippocampus, and left occipital lobe. No hemorrhage or mass effect. 2. Stable small acute/early subacute infarction within the left lateral pons. 3. Stable background of chronic microvascular ischemic changes, volume loss of the brain, and chronic infarction of the left inferior cerebellum. These results will be called to the ordering clinician or representative by the Radiologist Assistant, and  communication documented in the PACS or zVision Dashboard. Electronically Signed   By: Kristine Garbe M.D.   On: 12/13/2018 14:58   Mr Brain Wo Contrast  Result Date: 12/12/2018 CLINICAL DATA:  84 y/o F; episodes of vertigo. Evaluation for stroke. EXAM: MRI HEAD WITHOUT CONTRAST TECHNIQUE: Multiplanar, multiecho pulse sequences of the brain and surrounding structures were obtained without intravenous contrast. COMPARISON:  12/12/2018 CT head. FINDINGS: Brain: 5 mm focus of reduced diffusion within the left lateral pons compatible with acute/early subacute infarction (series 5, image 61 and series 7, image 48). No associated hemorrhage or mass effect. Chronic hemosiderin stained infarction of the left inferomedial cerebellar hemisphere with laminar necrosis. Very small chronic infarction of the right inferior cerebellar hemisphere. Several nonspecific T2 FLAIR hyperintensities in subcortical and periventricular white matter are compatible with moderate chronic microvascular ischemic changes for age. Moderate volume loss of the brain. Additional punctate focus of susceptibility hypointensity is present within the right temporal periventricular white matter compatible with hemosiderin deposition of chronic hemorrhage. No acute extra-axial collection, hydrocephalus, mass effect, or herniation. Vascular: Normal flow voids. Skull and upper cervical spine: Normal marrow signal. Sinuses/Orbits: Moderate mucosal thickening of the right sphenoid sinus. No abnormal signal of the additional visible paranasal sinuses and the mastoid air cells. Bilateral intra-ocular lens replacement. Other: None. IMPRESSION: 1. 5 mm acute/early subacute infarction within the left lateral pons. No associated hemorrhage or mass effect. 2. Small chronic infarct in the left inferior medial cerebellar hemisphere and very small chronic infarction in the right inferior cerebellar hemisphere. 3. Moderate chronic microvascular ischemic  changes and volume loss of the brain. 4. Sphenoid sinus disease. These results will be called to the ordering clinician or representative by the Radiologist Assistant, and communication documented in the PACS or zVision Dashboard. Electronically Signed   By: Kristine Garbe M.D.   On: 12/12/2018 21:06   Dg Swallowing Func-speech Pathology  Result Date: 12/16/2018 Objective Swallowing Evaluation: Type of Study: Bedside Swallow Evaluation  Patient Details Name: Kayla Contreras MRN: [id number] Date of Birth:  [birth year] Today's Date: 12/16/2018 Time: SLP Start Time (ACUTE ONLY): 1015 -SLP Stop Time (ACUTE ONLY): 1040 SLP Time Calculation (min) (ACUTE ONLY): 25 min Past Medical History: Past Medical History: Diagnosis Date . Atrial fibrillation [facility])  Past Surgical History: Past Surgical History: Procedure Laterality Date . CARDIAC CATHETERIZATION   HPI: Soliyana Mcchristian is a 84 y.o. female past medical history of atrial fibrillation on Eliquis, BPPV in the past, anxiety, and history of vertigo. Presented to hospital with unsteady gait, no slurring of speech or swallowing problems. CT suggestive of cerebellar stroke. Patient passed the [facility]. Subsequently developed neuro changes; MRI was repeated 12/18, which revealed: Interval development of acute infarctions within the left-greater-than-right thalami, left posterior limb of internal capsule, left cerebral peduncle, left midbrain, left posterior hippocampus, and left occipital lobe. No hemorrhage or mass effect. 2. Stable small acute/early subacute infarction within the left lateral pons. 3. Stable background of chronic microvascular ischemic changes, volume loss of the brain, and chronic infarction of the left inferior cerebellum.   Subjective: arrives with daughter, son to fluoro Assessment / Plan / Recommendation CHL IP CLINICAL IMPRESSIONS 12/16/2018 Clinical Impression Pt presents with primary oral dysphagia, with premature spillage of liquids  resulting in penetration of thin, nectar thick liquids before the swallow. Swallow initiation more timely with honey-thick liquids, usually at the valleculae, with good airway protection. Prolonged/weak mastication and decreased bolus cohesion with solids, and there is moderate lingual residue and in the right lateral sulcus. Lingual residue cleared with cued swallow and sip of honey-thick liquid; SLP cleared sulcus with finger sweep. Mild lingual weakness, with intermittent mild residue in the valleculae and pyriforms, which clears with second swallow (sometimes volitional). Recommend she continue with dys 2, honey-thick liquids, meds crushed in puree, with good prognosis for advancement with improvements in oral control. Family present and educated re: findings, including use of pillows which were need for positioning pt upright/head forward posture. Recommend teaspoon trials of thinner consistencies with SLP. SLP Visit Diagnosis Dysphagia, oral phase (R13.11) Attention and concentration deficit following -- Frontal lobe and executive function deficit following -- Impact on safety and function --   CHL IP TREATMENT RECOMMENDATION 12/16/2018 Treatment Recommendations Therapy as outlined in treatment plan below;F/U MBS in --- days (Comment)   Prognosis 12/16/2018 Prognosis for Safe Diet Advancement Good Barriers to Reach Goals Cognitive deficits Barriers/Prognosis Comment -- CHL IP DIET RECOMMENDATION 12/16/2018 SLP Diet Recommendations Dysphagia 2 (Fine chop) solids;Honey thick liquids Liquid Administration via Cup Medication Administration Crushed with puree Compensations Slow rate;Small sips/bites;Minimize environmental distractions;Follow solids with liquid Postural Changes --   CHL IP OTHER RECOMMENDATIONS 12/16/2018 Recommended Consults -- Oral Care Recommendations Oral care BID Other Recommendations Order thickener from pharmacy   CHL IP FOLLOW UP RECOMMENDATIONS 12/16/2018 Follow up Recommendations Inpatient  Rehab   CHL IP FREQUENCY AND DURATION 12/16/2018 Speech Therapy Frequency (ACUTE ONLY) min 2x/week Treatment Duration 1 week      CHL IP ORAL PHASE 12/16/2018 Oral Phase Impaired Oral - Pudding Teaspoon -- Oral - Pudding Cup -- Oral - Honey Teaspoon Decreased bolus cohesion;Premature spillage;Reduced posterior propulsion Oral - Honey Cup Decreased bolus cohesion;Reduced posterior propulsion Oral - Nectar Teaspoon Decreased bolus cohesion;Premature spillage;Reduced posterior propulsion Oral - Nectar Cup Reduced posterior propulsion;Decreased bolus cohesion;Premature spillage Oral - Nectar Straw Reduced posterior propulsion;Decreased bolus cohesion;Premature spillage Oral - Thin Teaspoon Reduced posterior propulsion;Decreased bolus cohesion;Premature spillage Oral - Thin Cup -- Oral - Thin Straw -- Oral - Puree Decreased bolus cohesion;Reduced posterior propulsion Oral - Mech  Soft -- Oral - Regular Impaired mastication;Weak lingual manipulation;Reduced posterior propulsion;Right pocketing in lateral sulci;Lingual/palatal residue;Decreased bolus cohesion Oral - Multi-Consistency -- Oral - Pill -- Oral Phase - Comment --  CHL IP PHARYNGEAL PHASE 12/16/2018 Pharyngeal Phase Impaired Pharyngeal- Pudding Teaspoon -- Pharyngeal -- Pharyngeal- Pudding Cup -- Pharyngeal -- Pharyngeal- Honey Teaspoon -- Pharyngeal -- Pharyngeal- Honey Cup Delayed swallow initiation-vallecula;Pharyngeal residue - valleculae;Pharyngeal residue - pyriform;Reduced tongue base retraction Pharyngeal -- Pharyngeal- Nectar Teaspoon -- Pharyngeal -- Pharyngeal- Nectar Cup -- Pharyngeal -- Pharyngeal- Nectar Straw Delayed swallow initiation-pyriform sinuses;Reduced tongue base retraction;Penetration/Aspiration before swallow;Pharyngeal residue - valleculae;Pharyngeal residue - pyriform Pharyngeal Material enters airway, remains ABOVE vocal cords then ejected out;Material enters airway, remains ABOVE vocal cords and not ejected out Pharyngeal- Thin  Teaspoon -- Pharyngeal -- Pharyngeal- Thin Cup -- Pharyngeal -- Pharyngeal- Thin Straw -- Pharyngeal -- Pharyngeal- Puree Delayed swallow initiation-vallecula;Reduced tongue base retraction;Pharyngeal residue - valleculae Pharyngeal -- Pharyngeal- Mechanical Soft -- Pharyngeal -- Pharyngeal- Regular Delayed swallow initiation-vallecula;Reduced tongue base retraction;Pharyngeal residue - valleculae Pharyngeal -- Pharyngeal- Multi-consistency -- Pharyngeal -- Pharyngeal- Pill -- Pharyngeal -- Pharyngeal Comment --  CHL IP CERVICAL ESOPHAGEAL PHASE 12/16/2018 Cervical Esophageal Phase WFL Pudding Teaspoon -- Pudding Cup -- Honey Teaspoon -- Honey Cup -- Nectar Teaspoon -- Nectar Cup -- Nectar Straw -- Thin Teaspoon -- Thin Cup -- Thin Straw -- Puree -- Mechanical Soft -- Regular -- Multi-consistency -- Pill -- Cervical Esophageal Comment -- Deneise Lever, MS, CCC-SLP Speech-Language Pathologist Acute Rehabilitation Services Pager: [phone] Office: [phone] Aliene Altes 12/16/2018, 1:31 PM                  LAB RESULTS: Basic Metabolic Panel: Recent Labs  Lab 12/12/18 1346 12/17/18 0629  NA 139 145  K 3.9 3.9  CL 103 108  CO2 23 27  GLUCOSE 211* 133*  BUN 18 24*  CREATININE 1.02* 0.89  CALCIUM 9.3 9.2   Liver Function Tests: No results for input(s): AST, ALT, ALKPHOS, BILITOT, PROT, ALBUMIN in the last 168 hours. No results for input(s): LIPASE, AMYLASE in the last 168 hours. No results for input(s): AMMONIA in the last 168 hours. CBC: Recent Labs  Lab 12/12/18 1346 12/17/18 0629  WBC 6.1 6.9  HGB 12.0 13.7  HCT 38.0 43.1  MCV 92.0 92.1  PLT 178 163   Cardiac Enzymes: No results for input(s): CKTOTAL, CKMB, CKMBINDEX, TROPONINI in the last 168 hours. BNP: Invalid input(s): POCBNP CBG: Recent Labs  Lab 12/12/18 1701  GLUCAP 100*      Disposition and Follow-up: Discharge Instructions    Ambulatory referral to Neurology   Complete by:  As directed    Follow up  with stroke clinic NP (Jessica Vanschaick or Cecille Rubin, if both not available, consider Dr. Antony Contras, Dr. Bess Harvest, or Dr. Sarina Ill) at [facility] in about 4 weeks.  Pt for d/t to CIR vs SNF. Please contact dtr for appt   Increase activity slowly   Complete by:  As directed        DISPOSITION: Skilled nursing facility   DISCHARGE FOLLOW-UP Follow-up Information    [facility] Follow up in 4 week(s).   Specialty:  Neurology Why:  stroke clinic. office will call with appt date and time. Contact information: [address] [place] [phone]       Tisovec, Fransico Him, MD. Schedule an appointment as soon as possible for a visit in 2 week(s).   Specialty:  Internal Medicine Contact information: [place] [address] [phone]            Time coordinating discharge:  45 minutes  Signed:   Estill Cotta M.D. [facility] 12/18/2018, 11:42 AM Pager: [phone]

## 2018-12-18 NOTE — Progress Notes (Signed)
Physical Therapy Treatment Patient Details Name: Kayla Contreras MRN: [id number] DOB: 14-Jul-1932 Today's Date: 12/18/2018    History of Present Illness pt is an 84 y/o female with PMH of Afib and vertigo, Presenting with unsteady gait without report of extremity weakness, numbness or tingling.  MRI on 12/17 showing infarcts in the left lateral PONS and L cerbellum. Repeat MRI on 12/18 showed interval treatment of acute infarction in the left greater than right thalami, left posterior limb of internal capsule, left cerebral peduncle, left midbrain, left posterior hippocampus and left occipital lobe, no hemorrhage or mass-effect.     PT Comments    Pt making slow gains.  Emphasis today on transition to sitting, sit to stand and standing activity at EOB.   Follow Up Recommendations  SNF(pt did not meet CIr criteria)     Equipment Recommendations  Other (comment)(TBA next venue)    Recommendations for Other Services Rehab consult     Precautions / Restrictions Precautions Precautions: Fall Precaution Comments: R hemi    Mobility  Bed Mobility Overal bed mobility: Needs Assistance Bed Mobility: Supine to Sit;Sit to Sidelying     Supine to sit: Max assist   Sit to sidelying: Max assist;+2 for physical assistance General bed mobility comments: up via L elbow.  pt needed truncal stability so she could use her L UE to assist  Transfers Overall transfer level: Needs assistance Equipment used: 2 person hand held assist Transfers: Sit to/from Stand Sit to Stand: Max assist;+2 physical assistance;+2 safety/equipment(x2, limited further by stool)         General transfer comment: cues for hand placement, assist to help forward and boost.  Ambulation/Gait                 Stairs             Wheelchair Mobility    Modified Rankin (Stroke Patients Only) Modified Rankin (Stroke Patients Only) Modified Rankin: Severe disability     Balance     Sitting  balance-Leahy Scale: Poor       Standing balance-Leahy Scale: Zero Standing balance comment: strong left side compensates well to boost and maintain w/bearing.  R LE must be support.  Pt worked on w/shift and upright posture.                            Cognition Arousal/Alertness: Lethargic Behavior During Therapy: Flat affect;WFL for tasks assessed/performed Overall Cognitive Status: (NT formally, slow to respond to cues)                     Current Attention Level: Sustained                  Exercises      General Comments        Pertinent Vitals/Pain Pain Assessment: No/denies pain    Home Living                      Prior Function            PT Goals (current goals can now be found in the care plan section) Acute Rehab PT Goals PT Goal Formulation: Patient unable to participate in goal setting Time For Goal Achievement: 12/28/18 Potential to Achieve Goals: Fair Progress towards PT goals: Progressing toward goals    Frequency    Min 3X/week      PT Plan Current plan remains appropriate  Co-evaluation              AM-PAC PT "6 Clicks" Mobility   Outcome Measure  Help needed turning from your back to your side while in a flat bed without using bedrails?: Total Help needed moving from lying on your back to sitting on the side of a flat bed without using bedrails?: Total Help needed moving to and from a bed to a chair (including a wheelchair)?: Total Help needed standing up from a chair using your arms (e.g., wheelchair or bedside chair)?: Total Help needed to walk in hospital room?: Total Help needed climbing 3-5 steps with a railing? : Total 6 Click Score: 6    End of Session   Activity Tolerance: Patient tolerated treatment well Patient left: in bed;with call bell/phone within reach;with bed alarm set;Other (comment)(to be transported to SNF rehab later today.) Nurse Communication: Mobility status PT Visit  Diagnosis: Other abnormalities of gait and mobility (R26.89);Hemiplegia and hemiparesis Hemiplegia - Right/Left: Right Hemiplegia - dominant/non-dominant: Dominant Hemiplegia - caused by: Cerebral infarction     Time: 1542-1600 PT Time Calculation (min) (ACUTE ONLY): 18 min  Charges:  $Therapeutic Activity: 8-22 mins                     12/18/2018  Donnella Sham, PT Acute Rehabilitation Services [phone]  (pager) [phone]  (office)   Kayla Contreras 12/18/2018, 4:31 PM

## 2018-12-18 NOTE — Progress Notes (Signed)
Gave report to [employer] at [facility]. IV and tele removed.

## 2018-12-18 NOTE — Progress Notes (Signed)
IP rehab admissions - I spoke with daughter.  She says that she is interested in [facility] and that case manager, Claiborne Billings, has talked with her.  Patient does not have caregiver support and therefore will need SNF placement.  Call me for questions.  [phone]

## 2018-12-18 NOTE — Progress Notes (Signed)
IV and telemetry removed, Transported to facility by [facility]. No new questions or concerns

## 2018-12-18 NOTE — NC FL2 (Signed)
[place] LEVEL OF CARE SCREENING TOOL     IDENTIFICATION  Patient Name: Kayla Contreras Birthdate: 11-06-1932 Sex: female Admission Date (Current Location): 12/12/2018  [facility] and [place] Number:  Herbalist and Address:  [facility] [place], [place] [address] [place],  [address]      Provider Number: [id number]  Attending Physician Name and Address:  Mendel Corning, MD  Relative Name and Phone Number:       Current Level of Care: Hospital Recommended Level of Care: [place] Prior Approval Number:    Date Approved/Denied:   PASRR Number: [id number]  Discharge Plan: SNF    Current Diagnoses: Patient Active Problem List   Diagnosis Date Noted  . Vertigo as late effect of stroke   . Chronic pain syndrome   . Essential hypertension   . AKI (acute kidney injury) ([place])   . Stroke ([place]) 12/12/2018  . Atrial fibrillation ([place])     Orientation RESPIRATION BLADDER Height & Weight     Self  Normal Incontinent, External catheter Weight:   Height:     BEHAVIORAL SYMPTOMS/MOOD NEUROLOGICAL BOWEL NUTRITION STATUS      Continent Diet(See dc summary)  AMBULATORY STATUS COMMUNICATION OF NEEDS Skin   Extensive Assist Verbally Normal                       Personal Care Assistance Level of Assistance  Bathing, Feeding, Dressing Bathing Assistance: Maximum assistance Feeding assistance: Limited assistance Dressing Assistance: Maximum assistance     Functional Limitations Info  Sight, Hearing, Speech Sight Info: Adequate Hearing Info: Adequate Speech Info: Adequate    SPECIAL CARE FACTORS FREQUENCY  PT (By licensed PT), OT (By licensed OT)     PT Frequency: 5 OT Frequency: 5     Speech Therapy Frequency: 5x/wk      Contractures Contractures Info: Not present    Additional Factors Info  Code Status, Allergies Code Status Info: Full Code  Allergies Info: "other" Psychotropic Info: xanax 0.5  mg twice a day         Current Medications (12/18/2018):  This is the current hospital active medication list Current Facility-Administered Medications  Medication Dose Route Frequency Provider Last Rate Last Dose  .  stroke: mapping our early stages of recovery book   Does not apply Once Phillips Grout, MD      . acetaminophen (TYLENOL) tablet 650 mg  650 mg Oral Q4H PRN Phillips Grout, MD   650 mg at 12/15/18 1115   Or  . acetaminophen (TYLENOL) solution 650 mg  650 mg Per Tube Q4H PRN Phillips Grout, MD       Or  . acetaminophen (TYLENOL) suppository 650 mg  650 mg Rectal Q4H PRN Phillips Grout, MD      . ALPRAZolam Duanne Moron) tablet 0.5 mg  0.5 mg Oral BID Derrill Kay A, MD   0.5 mg at 12/18/18 1021  . apixaban (ELIQUIS) tablet 2.5 mg  2.5 mg Oral BID Burnetta Sabin L, NP   2.5 mg at 12/18/18 1021  . aspirin EC tablet 81 mg  81 mg Oral Daily Donzetta Starch, NP   81 mg at 12/18/18 1021  . bumetanide (BUMEX) tablet 2 mg  2 mg Oral Daily Derrill Kay A, MD   2 mg at 12/18/18 1021  . ferrous sulfate tablet 325 mg  325 mg Oral Q breakfast Phillips Grout, MD  325 mg at 12/18/18 1021  . losartan (COZAAR) tablet 50 mg  50 mg Oral BID Derrill Kay A, MD   50 mg at 12/18/18 1021  . magnesium oxide (MAG-OX) tablet 400 mg  400 mg Oral BID Derrill Kay A, MD   400 mg at 12/18/18 1021  . multivitamin (PROSIGHT) tablet 1 tablet  1 tablet Oral Q1200 Rai, Ripudeep K, MD   1 tablet at 12/17/18 1109  . multivitamin with minerals tablet 1 tablet  1 tablet Oral Daily Phillips Grout, MD   1 tablet at 12/18/18 1021  . potassium chloride (K-DUR,KLOR-CON) CR tablet 10 mEq  10 mEq Oral BID Phillips Grout, MD   10 mEq at 12/18/18 1021  . pravastatin (PRAVACHOL) tablet 40 mg  40 mg Oral q1800 Burnetta Sabin L, NP   40 mg at 12/17/18 1815  . vitamin B-12 (CYANOCOBALAMIN) tablet   Oral Daily Phillips Grout, MD   1,000 mcg at 12/18/18 1021     Discharge Medications: Please see discharge summary for a list  of discharge medications.  Relevant Imaging Results:  Relevant Lab Results:   Additional Information SS#: [id number]  Weston Anna, LCSW

## 2018-12-18 NOTE — Progress Notes (Signed)
  Speech Language Pathology Treatment: Dysphagia  Patient Details Name: Kayla Contreras MRN: [id number] DOB: 1932/08/26 Today's Date: 12/18/2018 Time:  -     Assessment / Plan / Recommendation Clinical Impression  Patient seen with lunch tray. She is able to hold cup independently, but requires moderate cueing for small sips. She was given trials of nectar thickened liquids by tsp with no s/s of aspiration. She also tolerated cup sips of nectar thickened liquids with close supervision and cueing for small sips. Patient continues to tolerate Dys 2, chopped/minced consistencies. Recommend Dys 2 diet with nectar thickened liquids. Patient will require close supervision for compensatory strategies for small sips of liquids, no straws and following solids with liquids.    HPI HPI: Kayla Contreras is a 84 y.o. female past medical history of atrial fibrillation on Eliquis, BPPV in the past, anxiety, and history of vertigo. Presented to hospital with unsteady gait, no slurring of speech or swallowing problems. CT suggestive of cerebellar stroke. Patient passed the [facility]. Subsequently developed neuro changes; MRI was repeated 12/18, which revealed: Interval development of acute infarctions within the left-greater-than-right thalami, left posterior limb of internal capsule, left cerebral peduncle, left midbrain, left posterior hippocampus, and left occipital lobe. No hemorrhage or mass effect. 2. Stable small acute/early subacute infarction within the left lateral pons. 3. Stable background of chronic microvascular ischemic changes, volume loss of the brain, and chronic infarction of the left inferior cerebellum.        SLP Plan  Continue with current plan of care       Recommendations  Diet recommendations: Dysphagia 2 (fine chop);Nectar-thick liquid Liquids provided via: Cup;No straw Medication Administration: Crushed with puree Supervision: Staff to assist with self feeding;Full  supervision/cueing for compensatory strategies Compensations: Slow rate;Small sips/bites;Minimize environmental distractions;Follow solids with liquid Postural Changes and/or Swallow Maneuvers: Seated upright 90 degrees;Upright 30-60 min after meal                Plan: Continue with current plan of care       [place], MA, CCC-SLP 12/18/2018 12:53 PM

## 2018-12-21 DIAGNOSIS — I69391 Dysphagia following cerebral infarction: Secondary | ICD-10-CM | POA: Diagnosis not present

## 2018-12-21 DIAGNOSIS — I5022 Chronic systolic (congestive) heart failure: Secondary | ICD-10-CM | POA: Diagnosis not present

## 2018-12-21 DIAGNOSIS — E1159 Type 2 diabetes mellitus with other circulatory complications: Secondary | ICD-10-CM | POA: Diagnosis not present

## 2018-12-21 DIAGNOSIS — F39 Unspecified mood [affective] disorder: Secondary | ICD-10-CM

## 2018-12-21 DIAGNOSIS — I4819 Other persistent atrial fibrillation: Secondary | ICD-10-CM | POA: Diagnosis not present

## 2018-12-21 DIAGNOSIS — G8191 Hemiplegia, unspecified affecting right dominant side: Secondary | ICD-10-CM | POA: Diagnosis not present

## 2018-12-21 DIAGNOSIS — F0150 Vascular dementia without behavioral disturbance: Secondary | ICD-10-CM | POA: Diagnosis not present

## 2019-01-04 DIAGNOSIS — I482 Chronic atrial fibrillation, unspecified: Secondary | ICD-10-CM

## 2019-01-04 DIAGNOSIS — F0150 Vascular dementia without behavioral disturbance: Secondary | ICD-10-CM

## 2019-01-04 DIAGNOSIS — E1159 Type 2 diabetes mellitus with other circulatory complications: Secondary | ICD-10-CM

## 2019-01-04 DIAGNOSIS — I5022 Chronic systolic (congestive) heart failure: Secondary | ICD-10-CM

## 2019-01-04 DIAGNOSIS — G8191 Hemiplegia, unspecified affecting right dominant side: Secondary | ICD-10-CM

## 2019-01-04 DIAGNOSIS — F39 Unspecified mood [affective] disorder: Secondary | ICD-10-CM

## 2019-01-09 DIAGNOSIS — R4189 Other symptoms and signs involving cognitive functions and awareness: Secondary | ICD-10-CM | POA: Diagnosis not present

## 2019-01-09 DIAGNOSIS — R278 Other lack of coordination: Secondary | ICD-10-CM | POA: Diagnosis not present

## 2019-01-09 DIAGNOSIS — I69398 Other sequelae of cerebral infarction: Secondary | ICD-10-CM | POA: Diagnosis not present

## 2019-01-09 DIAGNOSIS — I69351 Hemiplegia and hemiparesis following cerebral infarction affecting right dominant side: Secondary | ICD-10-CM | POA: Diagnosis not present

## 2019-01-09 DIAGNOSIS — M6281 Muscle weakness (generalized): Secondary | ICD-10-CM | POA: Diagnosis not present

## 2019-01-09 DIAGNOSIS — Z741 Need for assistance with personal care: Secondary | ICD-10-CM | POA: Diagnosis not present

## 2019-01-09 DIAGNOSIS — G9340 Encephalopathy, unspecified: Secondary | ICD-10-CM | POA: Diagnosis not present

## 2019-01-10 DIAGNOSIS — I69398 Other sequelae of cerebral infarction: Secondary | ICD-10-CM | POA: Diagnosis not present

## 2019-01-10 DIAGNOSIS — Z741 Need for assistance with personal care: Secondary | ICD-10-CM | POA: Diagnosis not present

## 2019-01-10 DIAGNOSIS — R4189 Other symptoms and signs involving cognitive functions and awareness: Secondary | ICD-10-CM | POA: Diagnosis not present

## 2019-01-10 DIAGNOSIS — R278 Other lack of coordination: Secondary | ICD-10-CM | POA: Diagnosis not present

## 2019-01-10 DIAGNOSIS — G9340 Encephalopathy, unspecified: Secondary | ICD-10-CM | POA: Diagnosis not present

## 2019-01-10 DIAGNOSIS — I69351 Hemiplegia and hemiparesis following cerebral infarction affecting right dominant side: Secondary | ICD-10-CM | POA: Diagnosis not present

## 2019-01-15 DIAGNOSIS — R278 Other lack of coordination: Secondary | ICD-10-CM | POA: Diagnosis not present

## 2019-01-15 DIAGNOSIS — Z741 Need for assistance with personal care: Secondary | ICD-10-CM | POA: Diagnosis not present

## 2019-01-15 DIAGNOSIS — I69351 Hemiplegia and hemiparesis following cerebral infarction affecting right dominant side: Secondary | ICD-10-CM | POA: Diagnosis not present

## 2019-01-15 DIAGNOSIS — R4189 Other symptoms and signs involving cognitive functions and awareness: Secondary | ICD-10-CM | POA: Diagnosis not present

## 2019-01-15 DIAGNOSIS — I69398 Other sequelae of cerebral infarction: Secondary | ICD-10-CM | POA: Diagnosis not present

## 2019-01-15 DIAGNOSIS — G9340 Encephalopathy, unspecified: Secondary | ICD-10-CM | POA: Diagnosis not present

## 2019-01-16 DIAGNOSIS — I69398 Other sequelae of cerebral infarction: Secondary | ICD-10-CM | POA: Diagnosis not present

## 2019-01-16 DIAGNOSIS — R4189 Other symptoms and signs involving cognitive functions and awareness: Secondary | ICD-10-CM | POA: Diagnosis not present

## 2019-01-16 DIAGNOSIS — I69351 Hemiplegia and hemiparesis following cerebral infarction affecting right dominant side: Secondary | ICD-10-CM | POA: Diagnosis not present

## 2019-01-16 DIAGNOSIS — G9340 Encephalopathy, unspecified: Secondary | ICD-10-CM | POA: Diagnosis not present

## 2019-01-16 DIAGNOSIS — Z741 Need for assistance with personal care: Secondary | ICD-10-CM | POA: Diagnosis not present

## 2019-01-16 DIAGNOSIS — R278 Other lack of coordination: Secondary | ICD-10-CM | POA: Diagnosis not present

## 2019-01-17 DIAGNOSIS — G9340 Encephalopathy, unspecified: Secondary | ICD-10-CM | POA: Diagnosis not present

## 2019-01-17 DIAGNOSIS — R278 Other lack of coordination: Secondary | ICD-10-CM | POA: Diagnosis not present

## 2019-01-17 DIAGNOSIS — I69398 Other sequelae of cerebral infarction: Secondary | ICD-10-CM | POA: Diagnosis not present

## 2019-01-17 DIAGNOSIS — I69351 Hemiplegia and hemiparesis following cerebral infarction affecting right dominant side: Secondary | ICD-10-CM | POA: Diagnosis not present

## 2019-01-17 DIAGNOSIS — Z741 Need for assistance with personal care: Secondary | ICD-10-CM | POA: Diagnosis not present

## 2019-01-17 DIAGNOSIS — R4189 Other symptoms and signs involving cognitive functions and awareness: Secondary | ICD-10-CM | POA: Diagnosis not present

## 2019-01-18 DIAGNOSIS — Z741 Need for assistance with personal care: Secondary | ICD-10-CM | POA: Diagnosis not present

## 2019-01-18 DIAGNOSIS — R4189 Other symptoms and signs involving cognitive functions and awareness: Secondary | ICD-10-CM | POA: Diagnosis not present

## 2019-01-18 DIAGNOSIS — G9340 Encephalopathy, unspecified: Secondary | ICD-10-CM | POA: Diagnosis not present

## 2019-01-18 DIAGNOSIS — R278 Other lack of coordination: Secondary | ICD-10-CM | POA: Diagnosis not present

## 2019-01-18 DIAGNOSIS — I69351 Hemiplegia and hemiparesis following cerebral infarction affecting right dominant side: Secondary | ICD-10-CM | POA: Diagnosis not present

## 2019-01-18 DIAGNOSIS — I69398 Other sequelae of cerebral infarction: Secondary | ICD-10-CM | POA: Diagnosis not present

## 2019-01-20 DIAGNOSIS — I69398 Other sequelae of cerebral infarction: Secondary | ICD-10-CM | POA: Diagnosis not present

## 2019-01-20 DIAGNOSIS — I69351 Hemiplegia and hemiparesis following cerebral infarction affecting right dominant side: Secondary | ICD-10-CM | POA: Diagnosis not present

## 2019-01-20 DIAGNOSIS — R278 Other lack of coordination: Secondary | ICD-10-CM | POA: Diagnosis not present

## 2019-01-20 DIAGNOSIS — Z741 Need for assistance with personal care: Secondary | ICD-10-CM | POA: Diagnosis not present

## 2019-01-20 DIAGNOSIS — R4189 Other symptoms and signs involving cognitive functions and awareness: Secondary | ICD-10-CM | POA: Diagnosis not present

## 2019-01-20 DIAGNOSIS — G9340 Encephalopathy, unspecified: Secondary | ICD-10-CM | POA: Diagnosis not present

## 2019-01-22 DIAGNOSIS — R278 Other lack of coordination: Secondary | ICD-10-CM | POA: Diagnosis not present

## 2019-01-22 DIAGNOSIS — R4189 Other symptoms and signs involving cognitive functions and awareness: Secondary | ICD-10-CM | POA: Diagnosis not present

## 2019-01-22 DIAGNOSIS — I69398 Other sequelae of cerebral infarction: Secondary | ICD-10-CM | POA: Diagnosis not present

## 2019-01-22 DIAGNOSIS — G9340 Encephalopathy, unspecified: Secondary | ICD-10-CM | POA: Diagnosis not present

## 2019-01-22 DIAGNOSIS — I69351 Hemiplegia and hemiparesis following cerebral infarction affecting right dominant side: Secondary | ICD-10-CM | POA: Diagnosis not present

## 2019-01-22 DIAGNOSIS — Z741 Need for assistance with personal care: Secondary | ICD-10-CM | POA: Diagnosis not present

## 2019-01-23 DIAGNOSIS — Z741 Need for assistance with personal care: Secondary | ICD-10-CM | POA: Diagnosis not present

## 2019-01-23 DIAGNOSIS — I69351 Hemiplegia and hemiparesis following cerebral infarction affecting right dominant side: Secondary | ICD-10-CM | POA: Diagnosis not present

## 2019-01-23 DIAGNOSIS — R4189 Other symptoms and signs involving cognitive functions and awareness: Secondary | ICD-10-CM | POA: Diagnosis not present

## 2019-01-23 DIAGNOSIS — I69398 Other sequelae of cerebral infarction: Secondary | ICD-10-CM | POA: Diagnosis not present

## 2019-01-23 DIAGNOSIS — R278 Other lack of coordination: Secondary | ICD-10-CM | POA: Diagnosis not present

## 2019-01-23 DIAGNOSIS — G9340 Encephalopathy, unspecified: Secondary | ICD-10-CM | POA: Diagnosis not present

## 2019-01-24 DIAGNOSIS — G9340 Encephalopathy, unspecified: Secondary | ICD-10-CM | POA: Diagnosis not present

## 2019-01-24 DIAGNOSIS — I69351 Hemiplegia and hemiparesis following cerebral infarction affecting right dominant side: Secondary | ICD-10-CM | POA: Diagnosis not present

## 2019-01-24 DIAGNOSIS — R4189 Other symptoms and signs involving cognitive functions and awareness: Secondary | ICD-10-CM | POA: Diagnosis not present

## 2019-01-24 DIAGNOSIS — R278 Other lack of coordination: Secondary | ICD-10-CM | POA: Diagnosis not present

## 2019-01-24 DIAGNOSIS — I69398 Other sequelae of cerebral infarction: Secondary | ICD-10-CM | POA: Diagnosis not present

## 2019-01-24 DIAGNOSIS — Z741 Need for assistance with personal care: Secondary | ICD-10-CM | POA: Diagnosis not present

## 2019-01-25 DIAGNOSIS — R278 Other lack of coordination: Secondary | ICD-10-CM | POA: Diagnosis not present

## 2019-01-25 DIAGNOSIS — I69351 Hemiplegia and hemiparesis following cerebral infarction affecting right dominant side: Secondary | ICD-10-CM | POA: Diagnosis not present

## 2019-01-25 DIAGNOSIS — Z741 Need for assistance with personal care: Secondary | ICD-10-CM | POA: Diagnosis not present

## 2019-01-25 DIAGNOSIS — G9340 Encephalopathy, unspecified: Secondary | ICD-10-CM | POA: Diagnosis not present

## 2019-01-25 DIAGNOSIS — R4189 Other symptoms and signs involving cognitive functions and awareness: Secondary | ICD-10-CM | POA: Diagnosis not present

## 2019-01-25 DIAGNOSIS — I69398 Other sequelae of cerebral infarction: Secondary | ICD-10-CM | POA: Diagnosis not present

## 2019-01-29 DIAGNOSIS — M6281 Muscle weakness (generalized): Secondary | ICD-10-CM | POA: Diagnosis not present

## 2019-01-29 DIAGNOSIS — G9340 Encephalopathy, unspecified: Secondary | ICD-10-CM | POA: Diagnosis not present

## 2019-01-29 DIAGNOSIS — I69398 Other sequelae of cerebral infarction: Secondary | ICD-10-CM | POA: Diagnosis not present

## 2019-01-29 DIAGNOSIS — Z741 Need for assistance with personal care: Secondary | ICD-10-CM | POA: Diagnosis not present

## 2019-01-29 DIAGNOSIS — R4189 Other symptoms and signs involving cognitive functions and awareness: Secondary | ICD-10-CM | POA: Diagnosis not present

## 2019-01-29 DIAGNOSIS — R278 Other lack of coordination: Secondary | ICD-10-CM | POA: Diagnosis not present

## 2019-01-30 DIAGNOSIS — G9340 Encephalopathy, unspecified: Secondary | ICD-10-CM | POA: Diagnosis not present

## 2019-01-30 DIAGNOSIS — R278 Other lack of coordination: Secondary | ICD-10-CM | POA: Diagnosis not present

## 2019-01-30 DIAGNOSIS — I69398 Other sequelae of cerebral infarction: Secondary | ICD-10-CM | POA: Diagnosis not present

## 2019-01-30 DIAGNOSIS — M6281 Muscle weakness (generalized): Secondary | ICD-10-CM | POA: Diagnosis not present

## 2019-01-30 DIAGNOSIS — Z741 Need for assistance with personal care: Secondary | ICD-10-CM | POA: Diagnosis not present

## 2019-01-30 DIAGNOSIS — R4189 Other symptoms and signs involving cognitive functions and awareness: Secondary | ICD-10-CM | POA: Diagnosis not present

## 2019-01-31 DIAGNOSIS — R278 Other lack of coordination: Secondary | ICD-10-CM | POA: Diagnosis not present

## 2019-01-31 DIAGNOSIS — R4189 Other symptoms and signs involving cognitive functions and awareness: Secondary | ICD-10-CM | POA: Diagnosis not present

## 2019-01-31 DIAGNOSIS — Z741 Need for assistance with personal care: Secondary | ICD-10-CM | POA: Diagnosis not present

## 2019-01-31 DIAGNOSIS — G9340 Encephalopathy, unspecified: Secondary | ICD-10-CM | POA: Diagnosis not present

## 2019-01-31 DIAGNOSIS — M6281 Muscle weakness (generalized): Secondary | ICD-10-CM | POA: Diagnosis not present

## 2019-01-31 DIAGNOSIS — I69398 Other sequelae of cerebral infarction: Secondary | ICD-10-CM | POA: Diagnosis not present

## 2019-02-02 DIAGNOSIS — R4189 Other symptoms and signs involving cognitive functions and awareness: Secondary | ICD-10-CM | POA: Diagnosis not present

## 2019-02-02 DIAGNOSIS — M6281 Muscle weakness (generalized): Secondary | ICD-10-CM | POA: Diagnosis not present

## 2019-02-02 DIAGNOSIS — R278 Other lack of coordination: Secondary | ICD-10-CM | POA: Diagnosis not present

## 2019-02-02 DIAGNOSIS — Z741 Need for assistance with personal care: Secondary | ICD-10-CM | POA: Diagnosis not present

## 2019-02-02 DIAGNOSIS — G9340 Encephalopathy, unspecified: Secondary | ICD-10-CM | POA: Diagnosis not present

## 2019-02-02 DIAGNOSIS — I69398 Other sequelae of cerebral infarction: Secondary | ICD-10-CM | POA: Diagnosis not present

## 2019-02-08 DIAGNOSIS — Z741 Need for assistance with personal care: Secondary | ICD-10-CM | POA: Diagnosis not present

## 2019-02-08 DIAGNOSIS — M6281 Muscle weakness (generalized): Secondary | ICD-10-CM | POA: Diagnosis not present

## 2019-02-08 DIAGNOSIS — R4189 Other symptoms and signs involving cognitive functions and awareness: Secondary | ICD-10-CM | POA: Diagnosis not present

## 2019-02-08 DIAGNOSIS — I69398 Other sequelae of cerebral infarction: Secondary | ICD-10-CM | POA: Diagnosis not present

## 2019-02-08 DIAGNOSIS — G9340 Encephalopathy, unspecified: Secondary | ICD-10-CM | POA: Diagnosis not present

## 2019-02-08 DIAGNOSIS — R278 Other lack of coordination: Secondary | ICD-10-CM | POA: Diagnosis not present

## 2019-02-12 DIAGNOSIS — Z741 Need for assistance with personal care: Secondary | ICD-10-CM | POA: Diagnosis not present

## 2019-02-12 DIAGNOSIS — R4189 Other symptoms and signs involving cognitive functions and awareness: Secondary | ICD-10-CM | POA: Diagnosis not present

## 2019-02-12 DIAGNOSIS — R278 Other lack of coordination: Secondary | ICD-10-CM | POA: Diagnosis not present

## 2019-02-12 DIAGNOSIS — I69398 Other sequelae of cerebral infarction: Secondary | ICD-10-CM | POA: Diagnosis not present

## 2019-02-12 DIAGNOSIS — M6281 Muscle weakness (generalized): Secondary | ICD-10-CM | POA: Diagnosis not present

## 2019-02-12 DIAGNOSIS — G9340 Encephalopathy, unspecified: Secondary | ICD-10-CM | POA: Diagnosis not present

## 2019-02-13 DIAGNOSIS — R4189 Other symptoms and signs involving cognitive functions and awareness: Secondary | ICD-10-CM | POA: Diagnosis not present

## 2019-02-13 DIAGNOSIS — R278 Other lack of coordination: Secondary | ICD-10-CM | POA: Diagnosis not present

## 2019-02-13 DIAGNOSIS — I69398 Other sequelae of cerebral infarction: Secondary | ICD-10-CM | POA: Diagnosis not present

## 2019-02-13 DIAGNOSIS — Z741 Need for assistance with personal care: Secondary | ICD-10-CM | POA: Diagnosis not present

## 2019-02-13 DIAGNOSIS — G9340 Encephalopathy, unspecified: Secondary | ICD-10-CM | POA: Diagnosis not present

## 2019-02-13 DIAGNOSIS — M6281 Muscle weakness (generalized): Secondary | ICD-10-CM | POA: Diagnosis not present

## 2019-02-14 DIAGNOSIS — E1159 Type 2 diabetes mellitus with other circulatory complications: Secondary | ICD-10-CM | POA: Diagnosis not present

## 2019-02-14 DIAGNOSIS — R1310 Dysphagia, unspecified: Secondary | ICD-10-CM | POA: Diagnosis not present

## 2019-02-14 DIAGNOSIS — G9340 Encephalopathy, unspecified: Secondary | ICD-10-CM | POA: Diagnosis not present

## 2019-02-14 DIAGNOSIS — Z741 Need for assistance with personal care: Secondary | ICD-10-CM | POA: Diagnosis not present

## 2019-02-14 DIAGNOSIS — I4891 Unspecified atrial fibrillation: Secondary | ICD-10-CM | POA: Diagnosis not present

## 2019-02-14 DIAGNOSIS — R4189 Other symptoms and signs involving cognitive functions and awareness: Secondary | ICD-10-CM | POA: Diagnosis not present

## 2019-02-14 DIAGNOSIS — R278 Other lack of coordination: Secondary | ICD-10-CM | POA: Diagnosis not present

## 2019-02-14 DIAGNOSIS — M6281 Muscle weakness (generalized): Secondary | ICD-10-CM | POA: Diagnosis not present

## 2019-02-14 DIAGNOSIS — I5020 Unspecified systolic (congestive) heart failure: Secondary | ICD-10-CM

## 2019-02-14 DIAGNOSIS — M545 Low back pain: Secondary | ICD-10-CM | POA: Diagnosis not present

## 2019-02-14 DIAGNOSIS — F0150 Vascular dementia without behavioral disturbance: Secondary | ICD-10-CM | POA: Diagnosis not present

## 2019-02-14 DIAGNOSIS — F39 Unspecified mood [affective] disorder: Secondary | ICD-10-CM | POA: Diagnosis not present

## 2019-02-14 DIAGNOSIS — I69398 Other sequelae of cerebral infarction: Secondary | ICD-10-CM | POA: Diagnosis not present

## 2019-02-19 DIAGNOSIS — G9340 Encephalopathy, unspecified: Secondary | ICD-10-CM | POA: Diagnosis not present

## 2019-02-19 DIAGNOSIS — I69398 Other sequelae of cerebral infarction: Secondary | ICD-10-CM | POA: Diagnosis not present

## 2019-02-19 DIAGNOSIS — R278 Other lack of coordination: Secondary | ICD-10-CM | POA: Diagnosis not present

## 2019-02-19 DIAGNOSIS — M6281 Muscle weakness (generalized): Secondary | ICD-10-CM | POA: Diagnosis not present

## 2019-02-19 DIAGNOSIS — R4189 Other symptoms and signs involving cognitive functions and awareness: Secondary | ICD-10-CM | POA: Diagnosis not present

## 2019-02-19 DIAGNOSIS — Z741 Need for assistance with personal care: Secondary | ICD-10-CM | POA: Diagnosis not present

## 2019-02-20 DIAGNOSIS — I69398 Other sequelae of cerebral infarction: Secondary | ICD-10-CM | POA: Diagnosis not present

## 2019-02-20 DIAGNOSIS — R4189 Other symptoms and signs involving cognitive functions and awareness: Secondary | ICD-10-CM | POA: Diagnosis not present

## 2019-02-20 DIAGNOSIS — Z741 Need for assistance with personal care: Secondary | ICD-10-CM | POA: Diagnosis not present

## 2019-02-20 DIAGNOSIS — R278 Other lack of coordination: Secondary | ICD-10-CM | POA: Diagnosis not present

## 2019-02-20 DIAGNOSIS — G9340 Encephalopathy, unspecified: Secondary | ICD-10-CM | POA: Diagnosis not present

## 2019-02-20 DIAGNOSIS — M6281 Muscle weakness (generalized): Secondary | ICD-10-CM | POA: Diagnosis not present

## 2019-02-23 DIAGNOSIS — R197 Diarrhea, unspecified: Secondary | ICD-10-CM | POA: Diagnosis not present

## 2019-03-08 DIAGNOSIS — F0150 Vascular dementia without behavioral disturbance: Secondary | ICD-10-CM | POA: Diagnosis not present

## 2019-03-08 DIAGNOSIS — I4819 Other persistent atrial fibrillation: Secondary | ICD-10-CM | POA: Diagnosis not present

## 2019-03-08 DIAGNOSIS — F39 Unspecified mood [affective] disorder: Secondary | ICD-10-CM | POA: Diagnosis not present

## 2019-03-08 DIAGNOSIS — E1159 Type 2 diabetes mellitus with other circulatory complications: Secondary | ICD-10-CM

## 2019-03-08 DIAGNOSIS — I69391 Dysphagia following cerebral infarction: Secondary | ICD-10-CM | POA: Diagnosis not present

## 2019-03-08 DIAGNOSIS — G8191 Hemiplegia, unspecified affecting right dominant side: Secondary | ICD-10-CM | POA: Diagnosis not present

## 2019-03-08 DIAGNOSIS — I5032 Chronic diastolic (congestive) heart failure: Secondary | ICD-10-CM | POA: Diagnosis not present

## 2019-05-02 DIAGNOSIS — M545 Low back pain: Secondary | ICD-10-CM | POA: Diagnosis not present

## 2019-05-02 DIAGNOSIS — F39 Unspecified mood [affective] disorder: Secondary | ICD-10-CM | POA: Diagnosis not present

## 2019-05-02 DIAGNOSIS — E1159 Type 2 diabetes mellitus with other circulatory complications: Secondary | ICD-10-CM

## 2019-05-02 DIAGNOSIS — I5020 Unspecified systolic (congestive) heart failure: Secondary | ICD-10-CM | POA: Diagnosis not present

## 2019-05-02 DIAGNOSIS — I69351 Hemiplegia and hemiparesis following cerebral infarction affecting right dominant side: Secondary | ICD-10-CM | POA: Diagnosis not present

## 2019-05-02 DIAGNOSIS — K219 Gastro-esophageal reflux disease without esophagitis: Secondary | ICD-10-CM | POA: Diagnosis not present

## 2019-05-02 DIAGNOSIS — I4891 Unspecified atrial fibrillation: Secondary | ICD-10-CM | POA: Diagnosis not present

## 2019-05-03 DIAGNOSIS — E119 Type 2 diabetes mellitus without complications: Secondary | ICD-10-CM | POA: Diagnosis not present

## 2019-05-11 DIAGNOSIS — L821 Other seborrheic keratosis: Secondary | ICD-10-CM | POA: Diagnosis not present

## 2019-06-11 DIAGNOSIS — I5022 Chronic systolic (congestive) heart failure: Secondary | ICD-10-CM | POA: Diagnosis not present

## 2019-06-11 DIAGNOSIS — R4189 Other symptoms and signs involving cognitive functions and awareness: Secondary | ICD-10-CM | POA: Diagnosis not present

## 2019-06-11 DIAGNOSIS — M1990 Unspecified osteoarthritis, unspecified site: Secondary | ICD-10-CM | POA: Diagnosis not present

## 2019-06-11 DIAGNOSIS — I69351 Hemiplegia and hemiparesis following cerebral infarction affecting right dominant side: Secondary | ICD-10-CM | POA: Diagnosis not present

## 2019-06-11 DIAGNOSIS — R293 Abnormal posture: Secondary | ICD-10-CM | POA: Diagnosis not present

## 2019-06-11 DIAGNOSIS — R278 Other lack of coordination: Secondary | ICD-10-CM | POA: Diagnosis not present

## 2019-06-11 DIAGNOSIS — F0150 Vascular dementia without behavioral disturbance: Secondary | ICD-10-CM | POA: Diagnosis not present

## 2019-06-11 DIAGNOSIS — Z741 Need for assistance with personal care: Secondary | ICD-10-CM | POA: Diagnosis not present

## 2019-06-12 DIAGNOSIS — R4189 Other symptoms and signs involving cognitive functions and awareness: Secondary | ICD-10-CM | POA: Diagnosis not present

## 2019-06-12 DIAGNOSIS — R278 Other lack of coordination: Secondary | ICD-10-CM | POA: Diagnosis not present

## 2019-06-12 DIAGNOSIS — I5022 Chronic systolic (congestive) heart failure: Secondary | ICD-10-CM | POA: Diagnosis not present

## 2019-06-12 DIAGNOSIS — M1990 Unspecified osteoarthritis, unspecified site: Secondary | ICD-10-CM | POA: Diagnosis not present

## 2019-06-12 DIAGNOSIS — I69351 Hemiplegia and hemiparesis following cerebral infarction affecting right dominant side: Secondary | ICD-10-CM | POA: Diagnosis not present

## 2019-06-12 DIAGNOSIS — Z741 Need for assistance with personal care: Secondary | ICD-10-CM | POA: Diagnosis not present

## 2019-06-13 DIAGNOSIS — R4189 Other symptoms and signs involving cognitive functions and awareness: Secondary | ICD-10-CM | POA: Diagnosis not present

## 2019-06-13 DIAGNOSIS — Z741 Need for assistance with personal care: Secondary | ICD-10-CM | POA: Diagnosis not present

## 2019-06-13 DIAGNOSIS — I69351 Hemiplegia and hemiparesis following cerebral infarction affecting right dominant side: Secondary | ICD-10-CM | POA: Diagnosis not present

## 2019-06-13 DIAGNOSIS — I5022 Chronic systolic (congestive) heart failure: Secondary | ICD-10-CM | POA: Diagnosis not present

## 2019-06-13 DIAGNOSIS — R278 Other lack of coordination: Secondary | ICD-10-CM | POA: Diagnosis not present

## 2019-06-13 DIAGNOSIS — M1990 Unspecified osteoarthritis, unspecified site: Secondary | ICD-10-CM | POA: Diagnosis not present

## 2019-06-14 DIAGNOSIS — Z741 Need for assistance with personal care: Secondary | ICD-10-CM | POA: Diagnosis not present

## 2019-06-14 DIAGNOSIS — R278 Other lack of coordination: Secondary | ICD-10-CM | POA: Diagnosis not present

## 2019-06-14 DIAGNOSIS — I69351 Hemiplegia and hemiparesis following cerebral infarction affecting right dominant side: Secondary | ICD-10-CM | POA: Diagnosis not present

## 2019-06-14 DIAGNOSIS — I5022 Chronic systolic (congestive) heart failure: Secondary | ICD-10-CM | POA: Diagnosis not present

## 2019-06-14 DIAGNOSIS — M1990 Unspecified osteoarthritis, unspecified site: Secondary | ICD-10-CM | POA: Diagnosis not present

## 2019-06-14 DIAGNOSIS — R4189 Other symptoms and signs involving cognitive functions and awareness: Secondary | ICD-10-CM | POA: Diagnosis not present

## 2019-06-15 DIAGNOSIS — Z741 Need for assistance with personal care: Secondary | ICD-10-CM | POA: Diagnosis not present

## 2019-06-15 DIAGNOSIS — I5022 Chronic systolic (congestive) heart failure: Secondary | ICD-10-CM | POA: Diagnosis not present

## 2019-06-15 DIAGNOSIS — R4189 Other symptoms and signs involving cognitive functions and awareness: Secondary | ICD-10-CM | POA: Diagnosis not present

## 2019-06-15 DIAGNOSIS — I69351 Hemiplegia and hemiparesis following cerebral infarction affecting right dominant side: Secondary | ICD-10-CM | POA: Diagnosis not present

## 2019-06-15 DIAGNOSIS — M1990 Unspecified osteoarthritis, unspecified site: Secondary | ICD-10-CM | POA: Diagnosis not present

## 2019-06-15 DIAGNOSIS — R278 Other lack of coordination: Secondary | ICD-10-CM | POA: Diagnosis not present

## 2019-06-18 DIAGNOSIS — I5022 Chronic systolic (congestive) heart failure: Secondary | ICD-10-CM | POA: Diagnosis not present

## 2019-06-18 DIAGNOSIS — R4189 Other symptoms and signs involving cognitive functions and awareness: Secondary | ICD-10-CM | POA: Diagnosis not present

## 2019-06-18 DIAGNOSIS — I69351 Hemiplegia and hemiparesis following cerebral infarction affecting right dominant side: Secondary | ICD-10-CM | POA: Diagnosis not present

## 2019-06-18 DIAGNOSIS — M1990 Unspecified osteoarthritis, unspecified site: Secondary | ICD-10-CM | POA: Diagnosis not present

## 2019-06-18 DIAGNOSIS — R278 Other lack of coordination: Secondary | ICD-10-CM | POA: Diagnosis not present

## 2019-06-18 DIAGNOSIS — Z741 Need for assistance with personal care: Secondary | ICD-10-CM | POA: Diagnosis not present

## 2019-06-19 DIAGNOSIS — R4189 Other symptoms and signs involving cognitive functions and awareness: Secondary | ICD-10-CM | POA: Diagnosis not present

## 2019-06-19 DIAGNOSIS — Z741 Need for assistance with personal care: Secondary | ICD-10-CM | POA: Diagnosis not present

## 2019-06-19 DIAGNOSIS — R278 Other lack of coordination: Secondary | ICD-10-CM | POA: Diagnosis not present

## 2019-06-19 DIAGNOSIS — I5022 Chronic systolic (congestive) heart failure: Secondary | ICD-10-CM | POA: Diagnosis not present

## 2019-06-19 DIAGNOSIS — I69351 Hemiplegia and hemiparesis following cerebral infarction affecting right dominant side: Secondary | ICD-10-CM | POA: Diagnosis not present

## 2019-06-19 DIAGNOSIS — M1990 Unspecified osteoarthritis, unspecified site: Secondary | ICD-10-CM | POA: Diagnosis not present

## 2019-06-20 DIAGNOSIS — I5022 Chronic systolic (congestive) heart failure: Secondary | ICD-10-CM | POA: Diagnosis not present

## 2019-06-20 DIAGNOSIS — Z741 Need for assistance with personal care: Secondary | ICD-10-CM | POA: Diagnosis not present

## 2019-06-20 DIAGNOSIS — M1990 Unspecified osteoarthritis, unspecified site: Secondary | ICD-10-CM | POA: Diagnosis not present

## 2019-06-20 DIAGNOSIS — R278 Other lack of coordination: Secondary | ICD-10-CM | POA: Diagnosis not present

## 2019-06-20 DIAGNOSIS — I69351 Hemiplegia and hemiparesis following cerebral infarction affecting right dominant side: Secondary | ICD-10-CM | POA: Diagnosis not present

## 2019-06-20 DIAGNOSIS — R4189 Other symptoms and signs involving cognitive functions and awareness: Secondary | ICD-10-CM | POA: Diagnosis not present

## 2019-06-21 DIAGNOSIS — I5022 Chronic systolic (congestive) heart failure: Secondary | ICD-10-CM | POA: Diagnosis not present

## 2019-06-21 DIAGNOSIS — M1990 Unspecified osteoarthritis, unspecified site: Secondary | ICD-10-CM | POA: Diagnosis not present

## 2019-06-21 DIAGNOSIS — Z741 Need for assistance with personal care: Secondary | ICD-10-CM | POA: Diagnosis not present

## 2019-06-21 DIAGNOSIS — R4189 Other symptoms and signs involving cognitive functions and awareness: Secondary | ICD-10-CM | POA: Diagnosis not present

## 2019-06-21 DIAGNOSIS — I69351 Hemiplegia and hemiparesis following cerebral infarction affecting right dominant side: Secondary | ICD-10-CM | POA: Diagnosis not present

## 2019-06-21 DIAGNOSIS — R278 Other lack of coordination: Secondary | ICD-10-CM | POA: Diagnosis not present

## 2019-06-22 DIAGNOSIS — I69351 Hemiplegia and hemiparesis following cerebral infarction affecting right dominant side: Secondary | ICD-10-CM | POA: Diagnosis not present

## 2019-06-22 DIAGNOSIS — I5022 Chronic systolic (congestive) heart failure: Secondary | ICD-10-CM | POA: Diagnosis not present

## 2019-06-22 DIAGNOSIS — R4189 Other symptoms and signs involving cognitive functions and awareness: Secondary | ICD-10-CM | POA: Diagnosis not present

## 2019-06-22 DIAGNOSIS — M1990 Unspecified osteoarthritis, unspecified site: Secondary | ICD-10-CM | POA: Diagnosis not present

## 2019-06-22 DIAGNOSIS — R278 Other lack of coordination: Secondary | ICD-10-CM | POA: Diagnosis not present

## 2019-06-22 DIAGNOSIS — Z741 Need for assistance with personal care: Secondary | ICD-10-CM | POA: Diagnosis not present

## 2019-07-12 DIAGNOSIS — F39 Unspecified mood [affective] disorder: Secondary | ICD-10-CM

## 2019-07-12 DIAGNOSIS — E1159 Type 2 diabetes mellitus with other circulatory complications: Secondary | ICD-10-CM | POA: Diagnosis not present

## 2019-07-12 DIAGNOSIS — I480 Paroxysmal atrial fibrillation: Secondary | ICD-10-CM | POA: Diagnosis not present

## 2019-07-12 DIAGNOSIS — F0150 Vascular dementia without behavioral disturbance: Secondary | ICD-10-CM | POA: Diagnosis not present

## 2019-07-12 DIAGNOSIS — I5022 Chronic systolic (congestive) heart failure: Secondary | ICD-10-CM | POA: Diagnosis not present

## 2019-07-12 DIAGNOSIS — G8191 Hemiplegia, unspecified affecting right dominant side: Secondary | ICD-10-CM | POA: Diagnosis not present

## 2019-07-24 DIAGNOSIS — B342 Coronavirus infection, unspecified: Secondary | ICD-10-CM | POA: Diagnosis not present

## 2019-07-31 IMAGING — CT CT ANGIO NECK
1 of 8 series · 6 of 33 positions shown · IV contrast (iopamidol)
Comparison: Head CT 12/12/2018

CLINICAL DATA: Unresponsive patient

EXAM:
CT ANGIOGRAPHY HEAD AND NECK
TECHNIQUE: Multidetector CT imaging of the head and neck was performed using
the standard protocol during bolus administration of intravenous
contrast. Multiplanar CT image reconstructions and MIPs were
obtained to evaluate the vascular anatomy. Carotid stenosis
measurements (when applicable) are obtained utilizing NASCET
criteria, using the distal internal carotid diameter as the
denominator.
CONTRAST:  75mL G4MGWF-FWR IOPAMIDOL (G4MGWF-FWR) INJECTION 76%

[Series 8: cta neck axial · axial · 0.39mm/px · z∈[-241,-12]mm · 6 of 323 slices shown]
[im 47/323  soft-tissue]
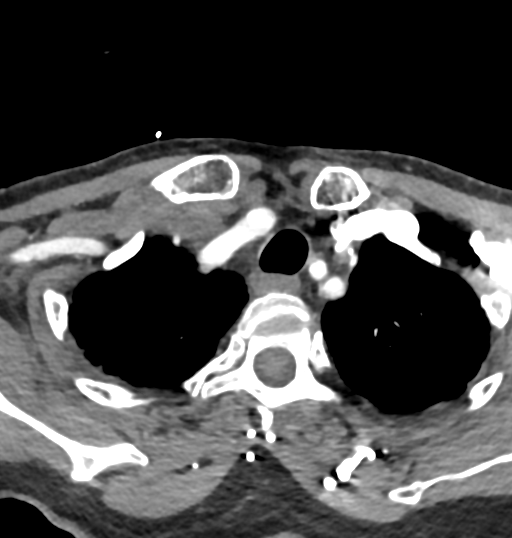
[im 93/323  bone]
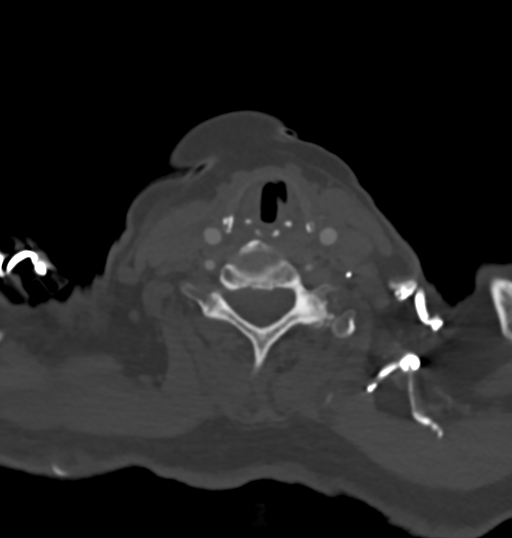
[im 139/323  soft-tissue]
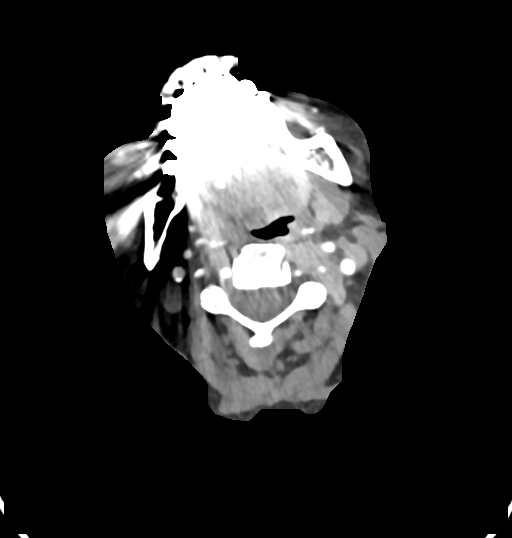
[im 185/323  bone]
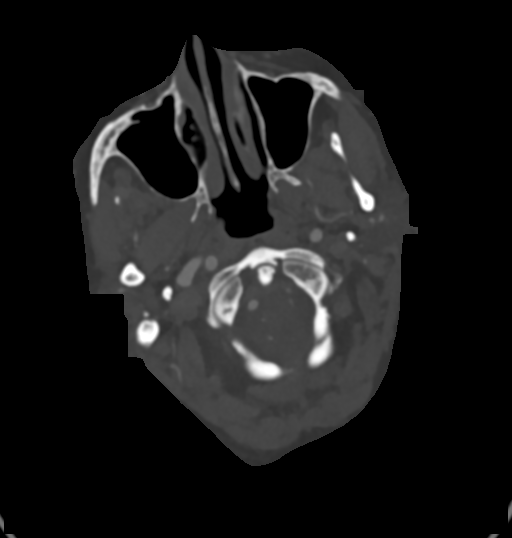
[im 231/323  soft-tissue]
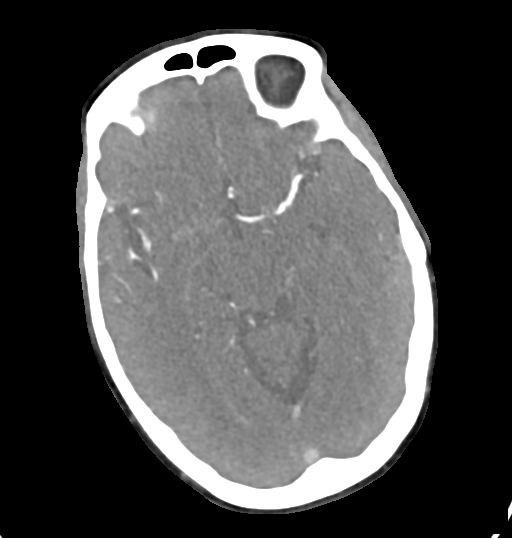
[im 277/323  bone]
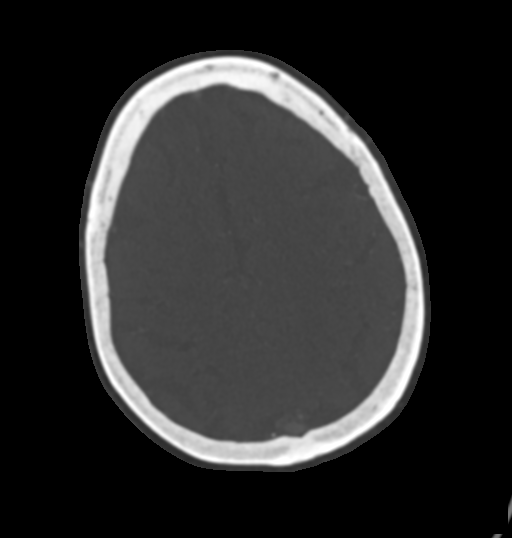

[6 of 33 positions shown; findings below may reference images not displayed]

FINDINGS: CTA NECK FINDINGS

SKELETON: There is no bony spinal canal stenosis. No lytic or
blastic lesion.

OTHER NECK: Normal pharynx, larynx and major salivary glands. No
cervical lymphadenopathy. Unremarkable thyroid gland.

UPPER CHEST: No pneumothorax or pleural effusion. No nodules or
masses.

AORTIC ARCH: There is no calcific atherosclerosis of the aortic
arch. There is no aneurysm, dissection or hemodynamically
significant stenosis of the visualized ascending aorta and aortic
arch. Conventional 3 vessel aortic branching pattern. There is
moderate stenosis of the proximal left subclavian artery.

RIGHT CAROTID SYSTEM:

--Common carotid artery: Widely patent origin without common carotid
artery dissection or aneurysm.

--Internal carotid artery: Normal without aneurysm, dissection or
stenosis.

--External carotid artery: No acute abnormality.

LEFT CAROTID SYSTEM:

--Common carotid artery: Widely patent origin without common carotid
artery dissection or aneurysm.

--Internal carotid artery: Normal without aneurysm, dissection or
stenosis.

--External carotid artery: No acute abnormality.

VERTEBRAL ARTERIES: Left dominant configuration. Both origins are
normal. No dissection, occlusion or flow-limiting stenosis to the
vertebrobasilar confluence.

CTA HEAD FINDINGS

ANTERIOR CIRCULATION:

--Intracranial internal carotid arteries: Normal.

--Anterior cerebral arteries: Normal. Both A1 segments are present.
Patent anterior communicating artery.

--Middle cerebral arteries: Normal.

--Posterior communicating arteries: Absent bilaterally.

POSTERIOR CIRCULATION:

--Basilar artery: Normal.

--Posterior cerebral arteries: Normal right PCA. The left PCA is
occluded, age indeterminate.

--Superior cerebellar arteries: Normal.

--Inferior cerebellar arteries: Normal anterior and posterior
inferior cerebellar arteries.

VENOUS SINUSES: As permitted by contrast timing, patent.

ANATOMIC VARIANTS: None

DELAYED PHASE: No parenchymal contrast enhancement.

Review of the MIP images confirms the above findings.
IMPRESSION: 1. No emergent large vessel occlusion.
2. Occlusion of the left PCA, age indeterminate.
3. Moderate stenosis of the proximal left subclavian artery.

## 2019-09-04 DIAGNOSIS — R293 Abnormal posture: Secondary | ICD-10-CM | POA: Diagnosis not present

## 2019-09-04 DIAGNOSIS — R2681 Unsteadiness on feet: Secondary | ICD-10-CM | POA: Diagnosis not present

## 2019-09-04 DIAGNOSIS — M1990 Unspecified osteoarthritis, unspecified site: Secondary | ICD-10-CM | POA: Diagnosis not present

## 2019-09-04 DIAGNOSIS — M6281 Muscle weakness (generalized): Secondary | ICD-10-CM | POA: Diagnosis not present

## 2019-09-04 DIAGNOSIS — F0150 Vascular dementia without behavioral disturbance: Secondary | ICD-10-CM | POA: Diagnosis not present

## 2019-09-04 DIAGNOSIS — R4189 Other symptoms and signs involving cognitive functions and awareness: Secondary | ICD-10-CM | POA: Diagnosis not present

## 2019-09-04 DIAGNOSIS — I5022 Chronic systolic (congestive) heart failure: Secondary | ICD-10-CM | POA: Diagnosis not present

## 2019-09-04 DIAGNOSIS — I69351 Hemiplegia and hemiparesis following cerebral infarction affecting right dominant side: Secondary | ICD-10-CM | POA: Diagnosis not present

## 2019-09-04 DIAGNOSIS — R278 Other lack of coordination: Secondary | ICD-10-CM | POA: Diagnosis not present

## 2019-09-05 DIAGNOSIS — R4189 Other symptoms and signs involving cognitive functions and awareness: Secondary | ICD-10-CM | POA: Diagnosis not present

## 2019-09-05 DIAGNOSIS — M6281 Muscle weakness (generalized): Secondary | ICD-10-CM | POA: Diagnosis not present

## 2019-09-05 DIAGNOSIS — M1990 Unspecified osteoarthritis, unspecified site: Secondary | ICD-10-CM | POA: Diagnosis not present

## 2019-09-05 DIAGNOSIS — I69351 Hemiplegia and hemiparesis following cerebral infarction affecting right dominant side: Secondary | ICD-10-CM | POA: Diagnosis not present

## 2019-09-05 DIAGNOSIS — R278 Other lack of coordination: Secondary | ICD-10-CM | POA: Diagnosis not present

## 2019-09-05 DIAGNOSIS — F0150 Vascular dementia without behavioral disturbance: Secondary | ICD-10-CM | POA: Diagnosis not present

## 2019-09-06 DIAGNOSIS — F0150 Vascular dementia without behavioral disturbance: Secondary | ICD-10-CM | POA: Diagnosis not present

## 2019-09-06 DIAGNOSIS — M6281 Muscle weakness (generalized): Secondary | ICD-10-CM | POA: Diagnosis not present

## 2019-09-06 DIAGNOSIS — R278 Other lack of coordination: Secondary | ICD-10-CM | POA: Diagnosis not present

## 2019-09-06 DIAGNOSIS — M1990 Unspecified osteoarthritis, unspecified site: Secondary | ICD-10-CM | POA: Diagnosis not present

## 2019-09-06 DIAGNOSIS — R4189 Other symptoms and signs involving cognitive functions and awareness: Secondary | ICD-10-CM | POA: Diagnosis not present

## 2019-09-06 DIAGNOSIS — I69351 Hemiplegia and hemiparesis following cerebral infarction affecting right dominant side: Secondary | ICD-10-CM | POA: Diagnosis not present

## 2019-09-07 DIAGNOSIS — R4189 Other symptoms and signs involving cognitive functions and awareness: Secondary | ICD-10-CM | POA: Diagnosis not present

## 2019-09-07 DIAGNOSIS — I69351 Hemiplegia and hemiparesis following cerebral infarction affecting right dominant side: Secondary | ICD-10-CM | POA: Diagnosis not present

## 2019-09-07 DIAGNOSIS — M1990 Unspecified osteoarthritis, unspecified site: Secondary | ICD-10-CM | POA: Diagnosis not present

## 2019-09-07 DIAGNOSIS — M6281 Muscle weakness (generalized): Secondary | ICD-10-CM | POA: Diagnosis not present

## 2019-09-07 DIAGNOSIS — F0150 Vascular dementia without behavioral disturbance: Secondary | ICD-10-CM | POA: Diagnosis not present

## 2019-09-07 DIAGNOSIS — R278 Other lack of coordination: Secondary | ICD-10-CM | POA: Diagnosis not present

## 2019-09-10 DIAGNOSIS — M1990 Unspecified osteoarthritis, unspecified site: Secondary | ICD-10-CM | POA: Diagnosis not present

## 2019-09-10 DIAGNOSIS — F0150 Vascular dementia without behavioral disturbance: Secondary | ICD-10-CM | POA: Diagnosis not present

## 2019-09-10 DIAGNOSIS — R4189 Other symptoms and signs involving cognitive functions and awareness: Secondary | ICD-10-CM | POA: Diagnosis not present

## 2019-09-10 DIAGNOSIS — I69351 Hemiplegia and hemiparesis following cerebral infarction affecting right dominant side: Secondary | ICD-10-CM | POA: Diagnosis not present

## 2019-09-10 DIAGNOSIS — M6281 Muscle weakness (generalized): Secondary | ICD-10-CM | POA: Diagnosis not present

## 2019-09-10 DIAGNOSIS — R278 Other lack of coordination: Secondary | ICD-10-CM | POA: Diagnosis not present

## 2019-09-11 DIAGNOSIS — R4189 Other symptoms and signs involving cognitive functions and awareness: Secondary | ICD-10-CM | POA: Diagnosis not present

## 2019-09-11 DIAGNOSIS — M6281 Muscle weakness (generalized): Secondary | ICD-10-CM | POA: Diagnosis not present

## 2019-09-11 DIAGNOSIS — R278 Other lack of coordination: Secondary | ICD-10-CM | POA: Diagnosis not present

## 2019-09-11 DIAGNOSIS — I69351 Hemiplegia and hemiparesis following cerebral infarction affecting right dominant side: Secondary | ICD-10-CM | POA: Diagnosis not present

## 2019-09-11 DIAGNOSIS — F0150 Vascular dementia without behavioral disturbance: Secondary | ICD-10-CM | POA: Diagnosis not present

## 2019-09-11 DIAGNOSIS — M1990 Unspecified osteoarthritis, unspecified site: Secondary | ICD-10-CM | POA: Diagnosis not present

## 2019-09-12 DIAGNOSIS — R278 Other lack of coordination: Secondary | ICD-10-CM | POA: Diagnosis not present

## 2019-09-12 DIAGNOSIS — I69351 Hemiplegia and hemiparesis following cerebral infarction affecting right dominant side: Secondary | ICD-10-CM | POA: Diagnosis not present

## 2019-09-12 DIAGNOSIS — M1990 Unspecified osteoarthritis, unspecified site: Secondary | ICD-10-CM | POA: Diagnosis not present

## 2019-09-12 DIAGNOSIS — M6281 Muscle weakness (generalized): Secondary | ICD-10-CM | POA: Diagnosis not present

## 2019-09-12 DIAGNOSIS — F0150 Vascular dementia without behavioral disturbance: Secondary | ICD-10-CM | POA: Diagnosis not present

## 2019-09-12 DIAGNOSIS — R4189 Other symptoms and signs involving cognitive functions and awareness: Secondary | ICD-10-CM | POA: Diagnosis not present

## 2019-09-13 DIAGNOSIS — R278 Other lack of coordination: Secondary | ICD-10-CM | POA: Diagnosis not present

## 2019-09-13 DIAGNOSIS — M1990 Unspecified osteoarthritis, unspecified site: Secondary | ICD-10-CM | POA: Diagnosis not present

## 2019-09-13 DIAGNOSIS — R4189 Other symptoms and signs involving cognitive functions and awareness: Secondary | ICD-10-CM | POA: Diagnosis not present

## 2019-09-13 DIAGNOSIS — I69351 Hemiplegia and hemiparesis following cerebral infarction affecting right dominant side: Secondary | ICD-10-CM | POA: Diagnosis not present

## 2019-09-13 DIAGNOSIS — F0150 Vascular dementia without behavioral disturbance: Secondary | ICD-10-CM | POA: Diagnosis not present

## 2019-09-13 DIAGNOSIS — M6281 Muscle weakness (generalized): Secondary | ICD-10-CM | POA: Diagnosis not present

## 2019-09-14 DIAGNOSIS — R4189 Other symptoms and signs involving cognitive functions and awareness: Secondary | ICD-10-CM | POA: Diagnosis not present

## 2019-09-14 DIAGNOSIS — F0150 Vascular dementia without behavioral disturbance: Secondary | ICD-10-CM | POA: Diagnosis not present

## 2019-09-14 DIAGNOSIS — I69351 Hemiplegia and hemiparesis following cerebral infarction affecting right dominant side: Secondary | ICD-10-CM | POA: Diagnosis not present

## 2019-09-14 DIAGNOSIS — R278 Other lack of coordination: Secondary | ICD-10-CM | POA: Diagnosis not present

## 2019-09-14 DIAGNOSIS — M1990 Unspecified osteoarthritis, unspecified site: Secondary | ICD-10-CM | POA: Diagnosis not present

## 2019-09-14 DIAGNOSIS — M6281 Muscle weakness (generalized): Secondary | ICD-10-CM | POA: Diagnosis not present

## 2019-09-17 DIAGNOSIS — I69351 Hemiplegia and hemiparesis following cerebral infarction affecting right dominant side: Secondary | ICD-10-CM | POA: Diagnosis not present

## 2019-09-17 DIAGNOSIS — F0150 Vascular dementia without behavioral disturbance: Secondary | ICD-10-CM | POA: Diagnosis not present

## 2019-09-17 DIAGNOSIS — R4189 Other symptoms and signs involving cognitive functions and awareness: Secondary | ICD-10-CM | POA: Diagnosis not present

## 2019-09-17 DIAGNOSIS — R278 Other lack of coordination: Secondary | ICD-10-CM | POA: Diagnosis not present

## 2019-09-17 DIAGNOSIS — M1990 Unspecified osteoarthritis, unspecified site: Secondary | ICD-10-CM | POA: Diagnosis not present

## 2019-09-17 DIAGNOSIS — M6281 Muscle weakness (generalized): Secondary | ICD-10-CM | POA: Diagnosis not present

## 2019-09-18 DIAGNOSIS — M6281 Muscle weakness (generalized): Secondary | ICD-10-CM | POA: Diagnosis not present

## 2019-09-18 DIAGNOSIS — R278 Other lack of coordination: Secondary | ICD-10-CM | POA: Diagnosis not present

## 2019-09-18 DIAGNOSIS — F0150 Vascular dementia without behavioral disturbance: Secondary | ICD-10-CM | POA: Diagnosis not present

## 2019-09-18 DIAGNOSIS — M1990 Unspecified osteoarthritis, unspecified site: Secondary | ICD-10-CM | POA: Diagnosis not present

## 2019-09-18 DIAGNOSIS — R4189 Other symptoms and signs involving cognitive functions and awareness: Secondary | ICD-10-CM | POA: Diagnosis not present

## 2019-09-18 DIAGNOSIS — I69351 Hemiplegia and hemiparesis following cerebral infarction affecting right dominant side: Secondary | ICD-10-CM | POA: Diagnosis not present

## 2019-09-19 DIAGNOSIS — I69351 Hemiplegia and hemiparesis following cerebral infarction affecting right dominant side: Secondary | ICD-10-CM | POA: Diagnosis not present

## 2019-09-19 DIAGNOSIS — M1990 Unspecified osteoarthritis, unspecified site: Secondary | ICD-10-CM | POA: Diagnosis not present

## 2019-09-19 DIAGNOSIS — E1159 Type 2 diabetes mellitus with other circulatory complications: Secondary | ICD-10-CM | POA: Diagnosis not present

## 2019-09-19 DIAGNOSIS — I4891 Unspecified atrial fibrillation: Secondary | ICD-10-CM | POA: Diagnosis not present

## 2019-09-19 DIAGNOSIS — F0150 Vascular dementia without behavioral disturbance: Secondary | ICD-10-CM | POA: Diagnosis not present

## 2019-09-19 DIAGNOSIS — I5020 Unspecified systolic (congestive) heart failure: Secondary | ICD-10-CM

## 2019-09-19 DIAGNOSIS — R278 Other lack of coordination: Secondary | ICD-10-CM | POA: Diagnosis not present

## 2019-09-19 DIAGNOSIS — R4189 Other symptoms and signs involving cognitive functions and awareness: Secondary | ICD-10-CM | POA: Diagnosis not present

## 2019-09-19 DIAGNOSIS — I69359 Hemiplegia and hemiparesis following cerebral infarction affecting unspecified side: Secondary | ICD-10-CM | POA: Diagnosis not present

## 2019-09-19 DIAGNOSIS — F39 Unspecified mood [affective] disorder: Secondary | ICD-10-CM | POA: Diagnosis not present

## 2019-09-19 DIAGNOSIS — M6281 Muscle weakness (generalized): Secondary | ICD-10-CM | POA: Diagnosis not present

## 2019-09-19 DIAGNOSIS — K219 Gastro-esophageal reflux disease without esophagitis: Secondary | ICD-10-CM

## 2019-09-20 DIAGNOSIS — F0150 Vascular dementia without behavioral disturbance: Secondary | ICD-10-CM | POA: Diagnosis not present

## 2019-09-20 DIAGNOSIS — M1990 Unspecified osteoarthritis, unspecified site: Secondary | ICD-10-CM | POA: Diagnosis not present

## 2019-09-20 DIAGNOSIS — M6281 Muscle weakness (generalized): Secondary | ICD-10-CM | POA: Diagnosis not present

## 2019-09-20 DIAGNOSIS — R278 Other lack of coordination: Secondary | ICD-10-CM | POA: Diagnosis not present

## 2019-09-20 DIAGNOSIS — R4189 Other symptoms and signs involving cognitive functions and awareness: Secondary | ICD-10-CM | POA: Diagnosis not present

## 2019-09-20 DIAGNOSIS — I69351 Hemiplegia and hemiparesis following cerebral infarction affecting right dominant side: Secondary | ICD-10-CM | POA: Diagnosis not present

## 2019-09-24 DIAGNOSIS — M6281 Muscle weakness (generalized): Secondary | ICD-10-CM | POA: Diagnosis not present

## 2019-09-24 DIAGNOSIS — F0150 Vascular dementia without behavioral disturbance: Secondary | ICD-10-CM | POA: Diagnosis not present

## 2019-09-24 DIAGNOSIS — R278 Other lack of coordination: Secondary | ICD-10-CM | POA: Diagnosis not present

## 2019-09-24 DIAGNOSIS — I69351 Hemiplegia and hemiparesis following cerebral infarction affecting right dominant side: Secondary | ICD-10-CM | POA: Diagnosis not present

## 2019-09-24 DIAGNOSIS — M1990 Unspecified osteoarthritis, unspecified site: Secondary | ICD-10-CM | POA: Diagnosis not present

## 2019-09-24 DIAGNOSIS — R4189 Other symptoms and signs involving cognitive functions and awareness: Secondary | ICD-10-CM | POA: Diagnosis not present

## 2019-09-25 DIAGNOSIS — R4189 Other symptoms and signs involving cognitive functions and awareness: Secondary | ICD-10-CM | POA: Diagnosis not present

## 2019-09-25 DIAGNOSIS — I69351 Hemiplegia and hemiparesis following cerebral infarction affecting right dominant side: Secondary | ICD-10-CM | POA: Diagnosis not present

## 2019-09-25 DIAGNOSIS — M6281 Muscle weakness (generalized): Secondary | ICD-10-CM | POA: Diagnosis not present

## 2019-09-25 DIAGNOSIS — M1990 Unspecified osteoarthritis, unspecified site: Secondary | ICD-10-CM | POA: Diagnosis not present

## 2019-09-25 DIAGNOSIS — F0150 Vascular dementia without behavioral disturbance: Secondary | ICD-10-CM | POA: Diagnosis not present

## 2019-09-25 DIAGNOSIS — R278 Other lack of coordination: Secondary | ICD-10-CM | POA: Diagnosis not present

## 2019-09-28 DIAGNOSIS — Z03818 Encounter for observation for suspected exposure to other biological agents ruled out: Secondary | ICD-10-CM | POA: Diagnosis not present

## 2019-10-09 DIAGNOSIS — Z03818 Encounter for observation for suspected exposure to other biological agents ruled out: Secondary | ICD-10-CM | POA: Diagnosis not present

## 2019-10-12 DIAGNOSIS — Z03818 Encounter for observation for suspected exposure to other biological agents ruled out: Secondary | ICD-10-CM | POA: Diagnosis not present

## 2019-10-26 DIAGNOSIS — Z03818 Encounter for observation for suspected exposure to other biological agents ruled out: Secondary | ICD-10-CM | POA: Diagnosis not present

## 2019-11-05 DIAGNOSIS — I5022 Chronic systolic (congestive) heart failure: Secondary | ICD-10-CM | POA: Diagnosis not present

## 2019-11-05 DIAGNOSIS — E1159 Type 2 diabetes mellitus with other circulatory complications: Secondary | ICD-10-CM | POA: Diagnosis not present

## 2019-11-06 DIAGNOSIS — M159 Polyosteoarthritis, unspecified: Secondary | ICD-10-CM

## 2019-11-06 DIAGNOSIS — E1159 Type 2 diabetes mellitus with other circulatory complications: Secondary | ICD-10-CM

## 2019-11-06 DIAGNOSIS — I480 Paroxysmal atrial fibrillation: Secondary | ICD-10-CM | POA: Diagnosis not present

## 2019-11-06 DIAGNOSIS — G8191 Hemiplegia, unspecified affecting right dominant side: Secondary | ICD-10-CM | POA: Diagnosis not present

## 2019-11-06 DIAGNOSIS — I5022 Chronic systolic (congestive) heart failure: Secondary | ICD-10-CM | POA: Diagnosis not present

## 2019-11-06 DIAGNOSIS — F0150 Vascular dementia without behavioral disturbance: Secondary | ICD-10-CM | POA: Diagnosis not present

## 2020-01-07 ENCOUNTER — Other Ambulatory Visit: Payer: Self-pay | Admitting: Internal Medicine

## 2020-01-07 DIAGNOSIS — Z1231 Encounter for screening mammogram for malignant neoplasm of breast: Secondary | ICD-10-CM

## 2020-01-17 DIAGNOSIS — I10 Essential (primary) hypertension: Secondary | ICD-10-CM | POA: Diagnosis not present

## 2020-01-18 DIAGNOSIS — E1169 Type 2 diabetes mellitus with other specified complication: Secondary | ICD-10-CM

## 2020-01-18 DIAGNOSIS — F0150 Vascular dementia without behavioral disturbance: Secondary | ICD-10-CM | POA: Diagnosis not present

## 2020-01-18 DIAGNOSIS — M1990 Unspecified osteoarthritis, unspecified site: Secondary | ICD-10-CM | POA: Diagnosis not present

## 2020-01-18 DIAGNOSIS — I5020 Unspecified systolic (congestive) heart failure: Secondary | ICD-10-CM | POA: Diagnosis not present

## 2020-01-18 DIAGNOSIS — K219 Gastro-esophageal reflux disease without esophagitis: Secondary | ICD-10-CM

## 2020-01-18 DIAGNOSIS — I4891 Unspecified atrial fibrillation: Secondary | ICD-10-CM | POA: Diagnosis not present

## 2020-01-18 DIAGNOSIS — I69359 Hemiplegia and hemiparesis following cerebral infarction affecting unspecified side: Secondary | ICD-10-CM

## 2020-02-04 DIAGNOSIS — Z23 Encounter for immunization: Secondary | ICD-10-CM | POA: Diagnosis not present

## 2020-03-03 DIAGNOSIS — Z23 Encounter for immunization: Secondary | ICD-10-CM | POA: Diagnosis not present

## 2020-03-11 DIAGNOSIS — G8191 Hemiplegia, unspecified affecting right dominant side: Secondary | ICD-10-CM | POA: Diagnosis not present

## 2020-03-11 DIAGNOSIS — F0150 Vascular dementia without behavioral disturbance: Secondary | ICD-10-CM | POA: Diagnosis not present

## 2020-03-11 DIAGNOSIS — I480 Paroxysmal atrial fibrillation: Secondary | ICD-10-CM | POA: Diagnosis not present

## 2020-03-11 DIAGNOSIS — I5022 Chronic systolic (congestive) heart failure: Secondary | ICD-10-CM | POA: Diagnosis not present

## 2020-03-11 DIAGNOSIS — E1159 Type 2 diabetes mellitus with other circulatory complications: Secondary | ICD-10-CM

## 2020-03-31 DIAGNOSIS — I10 Essential (primary) hypertension: Secondary | ICD-10-CM | POA: Diagnosis not present

## 2020-05-01 DIAGNOSIS — E1159 Type 2 diabetes mellitus with other circulatory complications: Secondary | ICD-10-CM | POA: Diagnosis not present

## 2020-05-07 DIAGNOSIS — I10 Essential (primary) hypertension: Secondary | ICD-10-CM | POA: Diagnosis not present

## 2020-05-07 DIAGNOSIS — I4891 Unspecified atrial fibrillation: Secondary | ICD-10-CM | POA: Diagnosis not present

## 2020-05-07 DIAGNOSIS — E1159 Type 2 diabetes mellitus with other circulatory complications: Secondary | ICD-10-CM

## 2020-05-07 DIAGNOSIS — I69359 Hemiplegia and hemiparesis following cerebral infarction affecting unspecified side: Secondary | ICD-10-CM | POA: Diagnosis not present

## 2020-05-07 DIAGNOSIS — K219 Gastro-esophageal reflux disease without esophagitis: Secondary | ICD-10-CM | POA: Diagnosis not present

## 2020-05-07 DIAGNOSIS — I5020 Unspecified systolic (congestive) heart failure: Secondary | ICD-10-CM

## 2020-05-07 DIAGNOSIS — F0150 Vascular dementia without behavioral disturbance: Secondary | ICD-10-CM | POA: Diagnosis not present

## 2020-05-07 DIAGNOSIS — M1990 Unspecified osteoarthritis, unspecified site: Secondary | ICD-10-CM | POA: Diagnosis not present

## 2020-06-16 DIAGNOSIS — I509 Heart failure, unspecified: Secondary | ICD-10-CM | POA: Diagnosis not present

## 2020-07-08 DIAGNOSIS — E1159 Type 2 diabetes mellitus with other circulatory complications: Secondary | ICD-10-CM | POA: Diagnosis not present

## 2020-07-08 DIAGNOSIS — I5022 Chronic systolic (congestive) heart failure: Secondary | ICD-10-CM | POA: Diagnosis not present

## 2020-07-08 DIAGNOSIS — F0150 Vascular dementia without behavioral disturbance: Secondary | ICD-10-CM | POA: Diagnosis not present

## 2020-07-08 DIAGNOSIS — G8191 Hemiplegia, unspecified affecting right dominant side: Secondary | ICD-10-CM | POA: Diagnosis not present

## 2020-07-08 DIAGNOSIS — I480 Paroxysmal atrial fibrillation: Secondary | ICD-10-CM | POA: Diagnosis not present

## 2020-08-28 DIAGNOSIS — J029 Acute pharyngitis, unspecified: Secondary | ICD-10-CM | POA: Diagnosis not present

## 2020-09-12 DIAGNOSIS — I4891 Unspecified atrial fibrillation: Secondary | ICD-10-CM | POA: Diagnosis not present

## 2020-09-12 DIAGNOSIS — I69351 Hemiplegia and hemiparesis following cerebral infarction affecting right dominant side: Secondary | ICD-10-CM | POA: Diagnosis not present

## 2020-09-12 DIAGNOSIS — K219 Gastro-esophageal reflux disease without esophagitis: Secondary | ICD-10-CM | POA: Diagnosis not present

## 2020-09-12 DIAGNOSIS — E1159 Type 2 diabetes mellitus with other circulatory complications: Secondary | ICD-10-CM | POA: Diagnosis not present

## 2020-09-12 DIAGNOSIS — I10 Essential (primary) hypertension: Secondary | ICD-10-CM | POA: Diagnosis not present

## 2020-09-12 DIAGNOSIS — M1990 Unspecified osteoarthritis, unspecified site: Secondary | ICD-10-CM | POA: Diagnosis not present

## 2020-09-12 DIAGNOSIS — F0150 Vascular dementia without behavioral disturbance: Secondary | ICD-10-CM | POA: Diagnosis not present

## 2020-09-12 DIAGNOSIS — I5020 Unspecified systolic (congestive) heart failure: Secondary | ICD-10-CM | POA: Diagnosis not present

## 2020-10-30 DIAGNOSIS — F0150 Vascular dementia without behavioral disturbance: Secondary | ICD-10-CM | POA: Diagnosis not present

## 2020-10-30 DIAGNOSIS — G8191 Hemiplegia, unspecified affecting right dominant side: Secondary | ICD-10-CM | POA: Diagnosis not present

## 2020-10-30 DIAGNOSIS — I480 Paroxysmal atrial fibrillation: Secondary | ICD-10-CM | POA: Diagnosis not present

## 2020-10-30 DIAGNOSIS — I5022 Chronic systolic (congestive) heart failure: Secondary | ICD-10-CM | POA: Diagnosis not present

## 2020-10-30 DIAGNOSIS — E1159 Type 2 diabetes mellitus with other circulatory complications: Secondary | ICD-10-CM | POA: Diagnosis not present

## 2020-11-03 DIAGNOSIS — E049 Nontoxic goiter, unspecified: Secondary | ICD-10-CM | POA: Diagnosis not present

## 2020-11-03 DIAGNOSIS — I5022 Chronic systolic (congestive) heart failure: Secondary | ICD-10-CM | POA: Diagnosis not present

## 2020-11-05 DIAGNOSIS — M545 Low back pain, unspecified: Secondary | ICD-10-CM | POA: Diagnosis not present

## 2020-11-07 DIAGNOSIS — Z23 Encounter for immunization: Secondary | ICD-10-CM | POA: Diagnosis not present

## 2020-12-24 DIAGNOSIS — B351 Tinea unguium: Secondary | ICD-10-CM | POA: Diagnosis not present

## 2021-01-07 DIAGNOSIS — I5020 Unspecified systolic (congestive) heart failure: Secondary | ICD-10-CM | POA: Diagnosis not present

## 2021-01-07 DIAGNOSIS — E1159 Type 2 diabetes mellitus with other circulatory complications: Secondary | ICD-10-CM | POA: Diagnosis not present

## 2021-01-07 DIAGNOSIS — I69351 Hemiplegia and hemiparesis following cerebral infarction affecting right dominant side: Secondary | ICD-10-CM | POA: Diagnosis not present

## 2021-01-07 DIAGNOSIS — F0150 Vascular dementia without behavioral disturbance: Secondary | ICD-10-CM | POA: Diagnosis not present

## 2021-01-07 DIAGNOSIS — K219 Gastro-esophageal reflux disease without esophagitis: Secondary | ICD-10-CM | POA: Diagnosis not present

## 2021-01-07 DIAGNOSIS — M1990 Unspecified osteoarthritis, unspecified site: Secondary | ICD-10-CM | POA: Diagnosis not present

## 2021-01-07 DIAGNOSIS — I4891 Unspecified atrial fibrillation: Secondary | ICD-10-CM | POA: Diagnosis not present

## 2021-01-19 DIAGNOSIS — E785 Hyperlipidemia, unspecified: Secondary | ICD-10-CM | POA: Diagnosis not present

## 2021-03-19 DIAGNOSIS — I5022 Chronic systolic (congestive) heart failure: Secondary | ICD-10-CM | POA: Diagnosis not present

## 2021-03-19 DIAGNOSIS — G8191 Hemiplegia, unspecified affecting right dominant side: Secondary | ICD-10-CM | POA: Diagnosis not present

## 2021-03-19 DIAGNOSIS — F0150 Vascular dementia without behavioral disturbance: Secondary | ICD-10-CM | POA: Diagnosis not present

## 2021-03-19 DIAGNOSIS — E1159 Type 2 diabetes mellitus with other circulatory complications: Secondary | ICD-10-CM | POA: Diagnosis not present

## 2021-03-19 DIAGNOSIS — I480 Paroxysmal atrial fibrillation: Secondary | ICD-10-CM | POA: Diagnosis not present

## 2021-04-27 DIAGNOSIS — E119 Type 2 diabetes mellitus without complications: Secondary | ICD-10-CM | POA: Diagnosis not present

## 2021-05-06 DIAGNOSIS — M1990 Unspecified osteoarthritis, unspecified site: Secondary | ICD-10-CM | POA: Diagnosis not present

## 2021-05-06 DIAGNOSIS — I5030 Unspecified diastolic (congestive) heart failure: Secondary | ICD-10-CM | POA: Diagnosis not present

## 2021-05-06 DIAGNOSIS — I10 Essential (primary) hypertension: Secondary | ICD-10-CM | POA: Diagnosis not present

## 2021-05-06 DIAGNOSIS — K219 Gastro-esophageal reflux disease without esophagitis: Secondary | ICD-10-CM | POA: Diagnosis not present

## 2021-05-06 DIAGNOSIS — F0150 Vascular dementia without behavioral disturbance: Secondary | ICD-10-CM | POA: Diagnosis not present

## 2021-05-06 DIAGNOSIS — E1159 Type 2 diabetes mellitus with other circulatory complications: Secondary | ICD-10-CM | POA: Diagnosis not present

## 2021-05-06 DIAGNOSIS — I4891 Unspecified atrial fibrillation: Secondary | ICD-10-CM | POA: Diagnosis not present

## 2021-05-06 DIAGNOSIS — I69351 Hemiplegia and hemiparesis following cerebral infarction affecting right dominant side: Secondary | ICD-10-CM | POA: Diagnosis not present

## 2021-05-15 DIAGNOSIS — Z23 Encounter for immunization: Secondary | ICD-10-CM | POA: Diagnosis not present

## 2021-07-02 DIAGNOSIS — G8191 Hemiplegia, unspecified affecting right dominant side: Secondary | ICD-10-CM | POA: Diagnosis not present

## 2021-07-02 DIAGNOSIS — M159 Polyosteoarthritis, unspecified: Secondary | ICD-10-CM | POA: Diagnosis not present

## 2021-07-02 DIAGNOSIS — E1159 Type 2 diabetes mellitus with other circulatory complications: Secondary | ICD-10-CM | POA: Diagnosis not present

## 2021-07-02 DIAGNOSIS — I5022 Chronic systolic (congestive) heart failure: Secondary | ICD-10-CM | POA: Diagnosis not present

## 2021-07-02 DIAGNOSIS — F0150 Vascular dementia without behavioral disturbance: Secondary | ICD-10-CM | POA: Diagnosis not present

## 2021-08-20 DIAGNOSIS — G894 Chronic pain syndrome: Secondary | ICD-10-CM | POA: Diagnosis not present

## 2021-08-20 DIAGNOSIS — I69351 Hemiplegia and hemiparesis following cerebral infarction affecting right dominant side: Secondary | ICD-10-CM | POA: Diagnosis not present

## 2021-08-20 DIAGNOSIS — I482 Chronic atrial fibrillation, unspecified: Secondary | ICD-10-CM | POA: Diagnosis not present

## 2021-08-20 DIAGNOSIS — M1990 Unspecified osteoarthritis, unspecified site: Secondary | ICD-10-CM | POA: Diagnosis not present

## 2021-08-20 DIAGNOSIS — I69321 Dysphasia following cerebral infarction: Secondary | ICD-10-CM | POA: Diagnosis not present

## 2021-08-20 DIAGNOSIS — F0150 Vascular dementia without behavioral disturbance: Secondary | ICD-10-CM | POA: Diagnosis not present

## 2021-08-20 DIAGNOSIS — Z741 Need for assistance with personal care: Secondary | ICD-10-CM | POA: Diagnosis not present

## 2021-08-20 DIAGNOSIS — M6281 Muscle weakness (generalized): Secondary | ICD-10-CM | POA: Diagnosis not present

## 2021-08-20 DIAGNOSIS — R4189 Other symptoms and signs involving cognitive functions and awareness: Secondary | ICD-10-CM | POA: Diagnosis not present

## 2021-08-20 DIAGNOSIS — M2450 Contracture, unspecified joint: Secondary | ICD-10-CM | POA: Diagnosis not present

## 2021-08-20 DIAGNOSIS — E119 Type 2 diabetes mellitus without complications: Secondary | ICD-10-CM | POA: Diagnosis not present

## 2021-08-20 DIAGNOSIS — I69398 Other sequelae of cerebral infarction: Secondary | ICD-10-CM | POA: Diagnosis not present

## 2021-08-20 DIAGNOSIS — R278 Other lack of coordination: Secondary | ICD-10-CM | POA: Diagnosis not present

## 2021-08-20 DIAGNOSIS — I5022 Chronic systolic (congestive) heart failure: Secondary | ICD-10-CM | POA: Diagnosis not present

## 2021-08-24 DIAGNOSIS — M1990 Unspecified osteoarthritis, unspecified site: Secondary | ICD-10-CM | POA: Diagnosis not present

## 2021-08-24 DIAGNOSIS — E119 Type 2 diabetes mellitus without complications: Secondary | ICD-10-CM | POA: Diagnosis not present

## 2021-08-24 DIAGNOSIS — I5022 Chronic systolic (congestive) heart failure: Secondary | ICD-10-CM | POA: Diagnosis not present

## 2021-08-24 DIAGNOSIS — F0150 Vascular dementia without behavioral disturbance: Secondary | ICD-10-CM | POA: Diagnosis not present

## 2021-08-24 DIAGNOSIS — I482 Chronic atrial fibrillation, unspecified: Secondary | ICD-10-CM | POA: Diagnosis not present

## 2021-08-24 DIAGNOSIS — I69351 Hemiplegia and hemiparesis following cerebral infarction affecting right dominant side: Secondary | ICD-10-CM | POA: Diagnosis not present

## 2021-09-05 DIAGNOSIS — I5022 Chronic systolic (congestive) heart failure: Secondary | ICD-10-CM | POA: Diagnosis not present

## 2021-09-05 DIAGNOSIS — M1990 Unspecified osteoarthritis, unspecified site: Secondary | ICD-10-CM | POA: Diagnosis not present

## 2021-09-05 DIAGNOSIS — R4189 Other symptoms and signs involving cognitive functions and awareness: Secondary | ICD-10-CM | POA: Diagnosis not present

## 2021-09-05 DIAGNOSIS — I69351 Hemiplegia and hemiparesis following cerebral infarction affecting right dominant side: Secondary | ICD-10-CM | POA: Diagnosis not present

## 2021-09-05 DIAGNOSIS — E119 Type 2 diabetes mellitus without complications: Secondary | ICD-10-CM | POA: Diagnosis not present

## 2021-09-05 DIAGNOSIS — M2450 Contracture, unspecified joint: Secondary | ICD-10-CM | POA: Diagnosis not present

## 2021-09-05 DIAGNOSIS — I482 Chronic atrial fibrillation, unspecified: Secondary | ICD-10-CM | POA: Diagnosis not present

## 2021-09-05 DIAGNOSIS — R278 Other lack of coordination: Secondary | ICD-10-CM | POA: Diagnosis not present

## 2021-09-05 DIAGNOSIS — F0150 Vascular dementia without behavioral disturbance: Secondary | ICD-10-CM | POA: Diagnosis not present

## 2021-09-05 DIAGNOSIS — Z741 Need for assistance with personal care: Secondary | ICD-10-CM | POA: Diagnosis not present

## 2021-09-05 DIAGNOSIS — I69398 Other sequelae of cerebral infarction: Secondary | ICD-10-CM | POA: Diagnosis not present

## 2021-09-05 DIAGNOSIS — M6281 Muscle weakness (generalized): Secondary | ICD-10-CM | POA: Diagnosis not present

## 2021-09-05 DIAGNOSIS — I69321 Dysphasia following cerebral infarction: Secondary | ICD-10-CM | POA: Diagnosis not present

## 2021-09-05 DIAGNOSIS — G894 Chronic pain syndrome: Secondary | ICD-10-CM | POA: Diagnosis not present

## 2021-09-07 DIAGNOSIS — M1990 Unspecified osteoarthritis, unspecified site: Secondary | ICD-10-CM | POA: Diagnosis not present

## 2021-09-07 DIAGNOSIS — I482 Chronic atrial fibrillation, unspecified: Secondary | ICD-10-CM | POA: Diagnosis not present

## 2021-09-07 DIAGNOSIS — I5022 Chronic systolic (congestive) heart failure: Secondary | ICD-10-CM | POA: Diagnosis not present

## 2021-09-07 DIAGNOSIS — I69351 Hemiplegia and hemiparesis following cerebral infarction affecting right dominant side: Secondary | ICD-10-CM | POA: Diagnosis not present

## 2021-09-07 DIAGNOSIS — F0150 Vascular dementia without behavioral disturbance: Secondary | ICD-10-CM | POA: Diagnosis not present

## 2021-09-07 DIAGNOSIS — E119 Type 2 diabetes mellitus without complications: Secondary | ICD-10-CM | POA: Diagnosis not present

## 2021-09-10 DIAGNOSIS — I5022 Chronic systolic (congestive) heart failure: Secondary | ICD-10-CM | POA: Diagnosis not present

## 2021-09-10 DIAGNOSIS — F0150 Vascular dementia without behavioral disturbance: Secondary | ICD-10-CM | POA: Diagnosis not present

## 2021-09-10 DIAGNOSIS — E119 Type 2 diabetes mellitus without complications: Secondary | ICD-10-CM | POA: Diagnosis not present

## 2021-09-10 DIAGNOSIS — I69351 Hemiplegia and hemiparesis following cerebral infarction affecting right dominant side: Secondary | ICD-10-CM | POA: Diagnosis not present

## 2021-09-10 DIAGNOSIS — M1990 Unspecified osteoarthritis, unspecified site: Secondary | ICD-10-CM | POA: Diagnosis not present

## 2021-09-10 DIAGNOSIS — I482 Chronic atrial fibrillation, unspecified: Secondary | ICD-10-CM | POA: Diagnosis not present

## 2021-09-11 DIAGNOSIS — E1159 Type 2 diabetes mellitus with other circulatory complications: Secondary | ICD-10-CM | POA: Diagnosis not present

## 2021-09-11 DIAGNOSIS — I69351 Hemiplegia and hemiparesis following cerebral infarction affecting right dominant side: Secondary | ICD-10-CM | POA: Diagnosis not present

## 2021-09-11 DIAGNOSIS — I4891 Unspecified atrial fibrillation: Secondary | ICD-10-CM | POA: Diagnosis not present

## 2021-09-11 DIAGNOSIS — K219 Gastro-esophageal reflux disease without esophagitis: Secondary | ICD-10-CM | POA: Diagnosis not present

## 2021-09-11 DIAGNOSIS — F0150 Vascular dementia without behavioral disturbance: Secondary | ICD-10-CM | POA: Diagnosis not present

## 2021-09-11 DIAGNOSIS — M1990 Unspecified osteoarthritis, unspecified site: Secondary | ICD-10-CM | POA: Diagnosis not present

## 2021-09-11 DIAGNOSIS — I5020 Unspecified systolic (congestive) heart failure: Secondary | ICD-10-CM | POA: Diagnosis not present

## 2021-09-11 DIAGNOSIS — I10 Essential (primary) hypertension: Secondary | ICD-10-CM | POA: Diagnosis not present

## 2021-09-15 DIAGNOSIS — F0150 Vascular dementia without behavioral disturbance: Secondary | ICD-10-CM | POA: Diagnosis not present

## 2021-09-15 DIAGNOSIS — I69351 Hemiplegia and hemiparesis following cerebral infarction affecting right dominant side: Secondary | ICD-10-CM | POA: Diagnosis not present

## 2021-09-15 DIAGNOSIS — M1990 Unspecified osteoarthritis, unspecified site: Secondary | ICD-10-CM | POA: Diagnosis not present

## 2021-09-15 DIAGNOSIS — E119 Type 2 diabetes mellitus without complications: Secondary | ICD-10-CM | POA: Diagnosis not present

## 2021-09-15 DIAGNOSIS — I482 Chronic atrial fibrillation, unspecified: Secondary | ICD-10-CM | POA: Diagnosis not present

## 2021-09-15 DIAGNOSIS — I5022 Chronic systolic (congestive) heart failure: Secondary | ICD-10-CM | POA: Diagnosis not present

## 2021-09-17 DIAGNOSIS — F0150 Vascular dementia without behavioral disturbance: Secondary | ICD-10-CM | POA: Diagnosis not present

## 2021-09-17 DIAGNOSIS — I69351 Hemiplegia and hemiparesis following cerebral infarction affecting right dominant side: Secondary | ICD-10-CM | POA: Diagnosis not present

## 2021-09-17 DIAGNOSIS — I482 Chronic atrial fibrillation, unspecified: Secondary | ICD-10-CM | POA: Diagnosis not present

## 2021-09-17 DIAGNOSIS — M1990 Unspecified osteoarthritis, unspecified site: Secondary | ICD-10-CM | POA: Diagnosis not present

## 2021-09-17 DIAGNOSIS — I5022 Chronic systolic (congestive) heart failure: Secondary | ICD-10-CM | POA: Diagnosis not present

## 2021-09-17 DIAGNOSIS — E119 Type 2 diabetes mellitus without complications: Secondary | ICD-10-CM | POA: Diagnosis not present

## 2021-09-18 DIAGNOSIS — Z23 Encounter for immunization: Secondary | ICD-10-CM | POA: Diagnosis not present

## 2021-09-22 DIAGNOSIS — I5022 Chronic systolic (congestive) heart failure: Secondary | ICD-10-CM | POA: Diagnosis not present

## 2021-09-22 DIAGNOSIS — E119 Type 2 diabetes mellitus without complications: Secondary | ICD-10-CM | POA: Diagnosis not present

## 2021-09-22 DIAGNOSIS — I482 Chronic atrial fibrillation, unspecified: Secondary | ICD-10-CM | POA: Diagnosis not present

## 2021-09-22 DIAGNOSIS — M1990 Unspecified osteoarthritis, unspecified site: Secondary | ICD-10-CM | POA: Diagnosis not present

## 2021-09-22 DIAGNOSIS — I69351 Hemiplegia and hemiparesis following cerebral infarction affecting right dominant side: Secondary | ICD-10-CM | POA: Diagnosis not present

## 2021-09-22 DIAGNOSIS — F0150 Vascular dementia without behavioral disturbance: Secondary | ICD-10-CM | POA: Diagnosis not present

## 2021-09-23 DIAGNOSIS — I5022 Chronic systolic (congestive) heart failure: Secondary | ICD-10-CM | POA: Diagnosis not present

## 2021-09-23 DIAGNOSIS — E119 Type 2 diabetes mellitus without complications: Secondary | ICD-10-CM | POA: Diagnosis not present

## 2021-09-23 DIAGNOSIS — M1990 Unspecified osteoarthritis, unspecified site: Secondary | ICD-10-CM | POA: Diagnosis not present

## 2021-09-23 DIAGNOSIS — F0150 Vascular dementia without behavioral disturbance: Secondary | ICD-10-CM | POA: Diagnosis not present

## 2021-09-23 DIAGNOSIS — I482 Chronic atrial fibrillation, unspecified: Secondary | ICD-10-CM | POA: Diagnosis not present

## 2021-09-23 DIAGNOSIS — I69351 Hemiplegia and hemiparesis following cerebral infarction affecting right dominant side: Secondary | ICD-10-CM | POA: Diagnosis not present

## 2021-09-24 DIAGNOSIS — R52 Pain, unspecified: Secondary | ICD-10-CM | POA: Diagnosis not present

## 2021-09-26 DIAGNOSIS — I69351 Hemiplegia and hemiparesis following cerebral infarction affecting right dominant side: Secondary | ICD-10-CM | POA: Diagnosis not present

## 2021-09-26 DIAGNOSIS — R278 Other lack of coordination: Secondary | ICD-10-CM | POA: Diagnosis not present

## 2021-09-26 DIAGNOSIS — F0150 Vascular dementia without behavioral disturbance: Secondary | ICD-10-CM | POA: Diagnosis not present

## 2021-09-26 DIAGNOSIS — M2450 Contracture, unspecified joint: Secondary | ICD-10-CM | POA: Diagnosis not present

## 2021-09-26 DIAGNOSIS — R4189 Other symptoms and signs involving cognitive functions and awareness: Secondary | ICD-10-CM | POA: Diagnosis not present

## 2021-09-26 DIAGNOSIS — M6281 Muscle weakness (generalized): Secondary | ICD-10-CM | POA: Diagnosis not present

## 2021-09-26 DIAGNOSIS — M1990 Unspecified osteoarthritis, unspecified site: Secondary | ICD-10-CM | POA: Diagnosis not present

## 2021-09-26 DIAGNOSIS — Z741 Need for assistance with personal care: Secondary | ICD-10-CM | POA: Diagnosis not present

## 2021-09-26 DIAGNOSIS — I69321 Dysphasia following cerebral infarction: Secondary | ICD-10-CM | POA: Diagnosis not present

## 2021-09-26 DIAGNOSIS — E119 Type 2 diabetes mellitus without complications: Secondary | ICD-10-CM | POA: Diagnosis not present

## 2021-09-26 DIAGNOSIS — G894 Chronic pain syndrome: Secondary | ICD-10-CM | POA: Diagnosis not present

## 2021-09-26 DIAGNOSIS — I5022 Chronic systolic (congestive) heart failure: Secondary | ICD-10-CM | POA: Diagnosis not present

## 2021-09-26 DIAGNOSIS — I69398 Other sequelae of cerebral infarction: Secondary | ICD-10-CM | POA: Diagnosis not present

## 2021-09-26 DIAGNOSIS — I482 Chronic atrial fibrillation, unspecified: Secondary | ICD-10-CM | POA: Diagnosis not present

## 2021-09-29 DIAGNOSIS — I69351 Hemiplegia and hemiparesis following cerebral infarction affecting right dominant side: Secondary | ICD-10-CM | POA: Diagnosis not present

## 2021-09-29 DIAGNOSIS — F0150 Vascular dementia without behavioral disturbance: Secondary | ICD-10-CM | POA: Diagnosis not present

## 2021-09-29 DIAGNOSIS — I482 Chronic atrial fibrillation, unspecified: Secondary | ICD-10-CM | POA: Diagnosis not present

## 2021-09-29 DIAGNOSIS — I5022 Chronic systolic (congestive) heart failure: Secondary | ICD-10-CM | POA: Diagnosis not present

## 2021-09-29 DIAGNOSIS — E119 Type 2 diabetes mellitus without complications: Secondary | ICD-10-CM | POA: Diagnosis not present

## 2021-09-29 DIAGNOSIS — M1990 Unspecified osteoarthritis, unspecified site: Secondary | ICD-10-CM | POA: Diagnosis not present

## 2021-10-01 DIAGNOSIS — E119 Type 2 diabetes mellitus without complications: Secondary | ICD-10-CM | POA: Diagnosis not present

## 2021-10-01 DIAGNOSIS — I5022 Chronic systolic (congestive) heart failure: Secondary | ICD-10-CM | POA: Diagnosis not present

## 2021-10-01 DIAGNOSIS — I69351 Hemiplegia and hemiparesis following cerebral infarction affecting right dominant side: Secondary | ICD-10-CM | POA: Diagnosis not present

## 2021-10-01 DIAGNOSIS — M1990 Unspecified osteoarthritis, unspecified site: Secondary | ICD-10-CM | POA: Diagnosis not present

## 2021-10-01 DIAGNOSIS — I482 Chronic atrial fibrillation, unspecified: Secondary | ICD-10-CM | POA: Diagnosis not present

## 2021-10-01 DIAGNOSIS — F0150 Vascular dementia without behavioral disturbance: Secondary | ICD-10-CM | POA: Diagnosis not present

## 2021-10-03 DIAGNOSIS — F0150 Vascular dementia without behavioral disturbance: Secondary | ICD-10-CM | POA: Diagnosis not present

## 2021-10-03 DIAGNOSIS — I5022 Chronic systolic (congestive) heart failure: Secondary | ICD-10-CM | POA: Diagnosis not present

## 2021-10-03 DIAGNOSIS — I69351 Hemiplegia and hemiparesis following cerebral infarction affecting right dominant side: Secondary | ICD-10-CM | POA: Diagnosis not present

## 2021-10-03 DIAGNOSIS — E119 Type 2 diabetes mellitus without complications: Secondary | ICD-10-CM | POA: Diagnosis not present

## 2021-10-03 DIAGNOSIS — I482 Chronic atrial fibrillation, unspecified: Secondary | ICD-10-CM | POA: Diagnosis not present

## 2021-10-03 DIAGNOSIS — M1990 Unspecified osteoarthritis, unspecified site: Secondary | ICD-10-CM | POA: Diagnosis not present

## 2021-10-06 DIAGNOSIS — E119 Type 2 diabetes mellitus without complications: Secondary | ICD-10-CM | POA: Diagnosis not present

## 2021-10-06 DIAGNOSIS — I69351 Hemiplegia and hemiparesis following cerebral infarction affecting right dominant side: Secondary | ICD-10-CM | POA: Diagnosis not present

## 2021-10-06 DIAGNOSIS — I482 Chronic atrial fibrillation, unspecified: Secondary | ICD-10-CM | POA: Diagnosis not present

## 2021-10-06 DIAGNOSIS — M1990 Unspecified osteoarthritis, unspecified site: Secondary | ICD-10-CM | POA: Diagnosis not present

## 2021-10-06 DIAGNOSIS — F0150 Vascular dementia without behavioral disturbance: Secondary | ICD-10-CM | POA: Diagnosis not present

## 2021-10-06 DIAGNOSIS — I5022 Chronic systolic (congestive) heart failure: Secondary | ICD-10-CM | POA: Diagnosis not present

## 2021-10-14 DIAGNOSIS — F0150 Vascular dementia without behavioral disturbance: Secondary | ICD-10-CM | POA: Diagnosis not present

## 2021-10-14 DIAGNOSIS — M1990 Unspecified osteoarthritis, unspecified site: Secondary | ICD-10-CM | POA: Diagnosis not present

## 2021-10-14 DIAGNOSIS — I5022 Chronic systolic (congestive) heart failure: Secondary | ICD-10-CM | POA: Diagnosis not present

## 2021-10-14 DIAGNOSIS — E119 Type 2 diabetes mellitus without complications: Secondary | ICD-10-CM | POA: Diagnosis not present

## 2021-10-14 DIAGNOSIS — I482 Chronic atrial fibrillation, unspecified: Secondary | ICD-10-CM | POA: Diagnosis not present

## 2021-10-14 DIAGNOSIS — I69351 Hemiplegia and hemiparesis following cerebral infarction affecting right dominant side: Secondary | ICD-10-CM | POA: Diagnosis not present

## 2021-10-29 DIAGNOSIS — I5022 Chronic systolic (congestive) heart failure: Secondary | ICD-10-CM | POA: Diagnosis not present

## 2021-10-29 DIAGNOSIS — E049 Nontoxic goiter, unspecified: Secondary | ICD-10-CM | POA: Diagnosis not present

## 2021-10-29 DIAGNOSIS — E119 Type 2 diabetes mellitus without complications: Secondary | ICD-10-CM | POA: Diagnosis not present

## 2021-11-05 DIAGNOSIS — I480 Paroxysmal atrial fibrillation: Secondary | ICD-10-CM | POA: Diagnosis not present

## 2021-11-05 DIAGNOSIS — F0150 Vascular dementia without behavioral disturbance: Secondary | ICD-10-CM | POA: Diagnosis not present

## 2021-11-05 DIAGNOSIS — I5022 Chronic systolic (congestive) heart failure: Secondary | ICD-10-CM | POA: Diagnosis not present

## 2021-11-05 DIAGNOSIS — E1159 Type 2 diabetes mellitus with other circulatory complications: Secondary | ICD-10-CM | POA: Diagnosis not present

## 2021-11-05 DIAGNOSIS — G8191 Hemiplegia, unspecified affecting right dominant side: Secondary | ICD-10-CM | POA: Diagnosis not present

## 2022-01-04 DIAGNOSIS — L97319 Non-pressure chronic ulcer of right ankle with unspecified severity: Secondary | ICD-10-CM | POA: Diagnosis not present

## 2022-01-04 DIAGNOSIS — L03116 Cellulitis of left lower limb: Secondary | ICD-10-CM | POA: Diagnosis not present

## 2022-01-08 DIAGNOSIS — I509 Heart failure, unspecified: Secondary | ICD-10-CM | POA: Diagnosis not present

## 2022-01-08 DIAGNOSIS — F0150 Vascular dementia without behavioral disturbance: Secondary | ICD-10-CM | POA: Diagnosis not present

## 2022-01-08 DIAGNOSIS — B351 Tinea unguium: Secondary | ICD-10-CM | POA: Diagnosis not present

## 2022-01-08 DIAGNOSIS — I69351 Hemiplegia and hemiparesis following cerebral infarction affecting right dominant side: Secondary | ICD-10-CM | POA: Diagnosis not present

## 2022-01-08 DIAGNOSIS — I4891 Unspecified atrial fibrillation: Secondary | ICD-10-CM | POA: Diagnosis not present

## 2022-01-08 DIAGNOSIS — E1159 Type 2 diabetes mellitus with other circulatory complications: Secondary | ICD-10-CM | POA: Diagnosis not present

## 2022-01-08 DIAGNOSIS — M1990 Unspecified osteoarthritis, unspecified site: Secondary | ICD-10-CM | POA: Diagnosis not present

## 2022-01-08 DIAGNOSIS — K219 Gastro-esophageal reflux disease without esophagitis: Secondary | ICD-10-CM | POA: Diagnosis not present

## 2022-02-02 DIAGNOSIS — L97319 Non-pressure chronic ulcer of right ankle with unspecified severity: Secondary | ICD-10-CM | POA: Diagnosis not present

## 2022-03-11 DIAGNOSIS — I480 Paroxysmal atrial fibrillation: Secondary | ICD-10-CM | POA: Diagnosis not present

## 2022-03-11 DIAGNOSIS — G8191 Hemiplegia, unspecified affecting right dominant side: Secondary | ICD-10-CM | POA: Diagnosis not present

## 2022-03-11 DIAGNOSIS — F0150 Vascular dementia without behavioral disturbance: Secondary | ICD-10-CM | POA: Diagnosis not present

## 2022-03-11 DIAGNOSIS — E1159 Type 2 diabetes mellitus with other circulatory complications: Secondary | ICD-10-CM | POA: Diagnosis not present

## 2022-03-11 DIAGNOSIS — I5022 Chronic systolic (congestive) heart failure: Secondary | ICD-10-CM | POA: Diagnosis not present

## 2022-03-23 DIAGNOSIS — I6781 Acute cerebrovascular insufficiency: Secondary | ICD-10-CM | POA: Diagnosis not present

## 2022-03-27 DEATH — deceased

## 2022-03-29 ENCOUNTER — Telehealth: Payer: Self-pay | Admitting: Internal Medicine

## 2022-03-29 NOTE — Telephone Encounter (Signed)
I will do the death certificate today ?

## 2022-03-29 NOTE — Telephone Encounter (Signed)
[facility] in [place] ? ?Butch Penny  [phone]   ? ?Butch Penny will send electronically for MD to sign ? ?If he doesn't agree  to sign the death certificate, please release it ?
